# Patient Record
Sex: Male | Born: 1935 | Race: White | Hispanic: No | Marital: Married | Smoking: Never smoker
Health system: Southern US, Community
[De-identification: ages and names within clinical notes are randomized; demographics above are authoritative.]

## PROBLEM LIST (undated history)

## (undated) DIAGNOSIS — C801 Malignant (primary) neoplasm, unspecified: Secondary | ICD-10-CM

## (undated) DIAGNOSIS — I10 Essential (primary) hypertension: Secondary | ICD-10-CM

## (undated) DIAGNOSIS — D649 Anemia, unspecified: Secondary | ICD-10-CM

## (undated) HISTORY — DX: Essential (primary) hypertension: I10

## (undated) HISTORY — DX: Anemia, unspecified: D64.9

## (undated) HISTORY — DX: Malignant (primary) neoplasm, unspecified: C80.1

---

## 2004-02-17 ENCOUNTER — Inpatient Hospital Stay: Payer: Self-pay | Admitting: Cardiovascular Disease

## 2004-02-17 ENCOUNTER — Other Ambulatory Visit: Payer: Self-pay

## 2007-08-01 ENCOUNTER — Other Ambulatory Visit: Payer: Self-pay

## 2007-08-01 ENCOUNTER — Ambulatory Visit: Payer: Self-pay | Admitting: Ophthalmology

## 2007-08-03 ENCOUNTER — Inpatient Hospital Stay: Payer: Self-pay | Admitting: Internal Medicine

## 2007-08-03 ENCOUNTER — Other Ambulatory Visit: Payer: Self-pay

## 2007-08-14 ENCOUNTER — Ambulatory Visit: Payer: Self-pay | Admitting: Ophthalmology

## 2007-11-21 ENCOUNTER — Ambulatory Visit: Payer: Self-pay | Admitting: Internal Medicine

## 2009-05-14 ENCOUNTER — Ambulatory Visit: Payer: Self-pay | Admitting: Internal Medicine

## 2010-07-03 ENCOUNTER — Ambulatory Visit: Payer: Self-pay | Admitting: Internal Medicine

## 2010-12-17 ENCOUNTER — Ambulatory Visit: Payer: Self-pay | Admitting: Internal Medicine

## 2011-03-15 ENCOUNTER — Ambulatory Visit: Payer: Self-pay | Admitting: Family

## 2011-12-27 ENCOUNTER — Ambulatory Visit: Payer: Self-pay | Admitting: Emergency Medicine

## 2012-05-17 HISTORY — PX: HIP FRACTURE SURGERY: SHX118

## 2012-05-22 ENCOUNTER — Inpatient Hospital Stay: Payer: Self-pay | Admitting: Orthopedic Surgery

## 2012-05-22 LAB — COMPREHENSIVE METABOLIC PANEL
Albumin: 3.5 g/dL (ref 3.4–5.0)
Alkaline Phosphatase: 98 U/L (ref 50–136)
Anion Gap: 10 (ref 7–16)
BUN: 22 mg/dL — ABNORMAL HIGH (ref 7–18)
Calcium, Total: 8 mg/dL — ABNORMAL LOW (ref 8.5–10.1)
Chloride: 104 mmol/L (ref 98–107)
Co2: 24 mmol/L (ref 21–32)
Creatinine: 1.4 mg/dL — ABNORMAL HIGH (ref 0.60–1.30)
EGFR (African American): 49 — ABNORMAL LOW
EGFR (Non-African Amer.): 43 — ABNORMAL LOW
Glucose: 109 mg/dL — ABNORMAL HIGH (ref 65–99)
Osmolality: 280 (ref 275–301)
SGOT(AST): 25 U/L (ref 15–37)
SGPT (ALT): 20 U/L (ref 12–78)
Total Protein: 7.1 g/dL (ref 6.4–8.2)

## 2012-05-22 LAB — URINALYSIS, COMPLETE
Glucose,UR: NEGATIVE mg/dL (ref 0–75)
Leukocyte Esterase: NEGATIVE
Ph: 7 (ref 4.5–8.0)
Protein: 30
RBC,UR: 13 /HPF (ref 0–5)
Squamous Epithelial: NONE SEEN

## 2012-05-22 LAB — CBC
HCT: 33.6 % — ABNORMAL LOW (ref 40.0–52.0)
HGB: 11.5 g/dL — ABNORMAL LOW (ref 13.0–18.0)
MCH: 33.4 pg (ref 26.0–34.0)
MCV: 98 fL (ref 80–100)
Platelet: 174 10*3/uL (ref 150–440)
RBC: 3.44 10*6/uL — ABNORMAL LOW (ref 4.40–5.90)
WBC: 10 10*3/uL (ref 3.8–10.6)

## 2012-05-23 LAB — APTT: Activated PTT: 39.4 secs — ABNORMAL HIGH (ref 23.6–35.9)

## 2012-05-23 LAB — CBC WITH DIFFERENTIAL/PLATELET
Eosinophil #: 0 10*3/uL (ref 0.0–0.7)
Eosinophil %: 0.1 %
HCT: 30.7 % — ABNORMAL LOW (ref 40.0–52.0)
HGB: 10.5 g/dL — ABNORMAL LOW (ref 13.0–18.0)
Lymphocyte %: 8.4 %
MCHC: 34.2 g/dL (ref 32.0–36.0)
Neutrophil #: 7.4 10*3/uL — ABNORMAL HIGH (ref 1.4–6.5)
Neutrophil %: 80.4 %
Platelet: 133 10*3/uL — ABNORMAL LOW (ref 150–440)
RBC: 3.12 10*6/uL — ABNORMAL LOW (ref 4.40–5.90)
WBC: 9.2 10*3/uL (ref 3.8–10.6)

## 2012-05-23 LAB — BASIC METABOLIC PANEL
Calcium, Total: 8.1 mg/dL — ABNORMAL LOW (ref 8.5–10.1)
Co2: 28 mmol/L (ref 21–32)
Creatinine: 1.36 mg/dL — ABNORMAL HIGH (ref 0.60–1.30)
EGFR (African American): 51 — ABNORMAL LOW
EGFR (Non-African Amer.): 44 — ABNORMAL LOW
Osmolality: 280 (ref 275–301)
Potassium: 4.8 mmol/L (ref 3.5–5.1)
Sodium: 138 mmol/L (ref 136–145)

## 2012-05-23 LAB — PROTIME-INR: INR: 1.2

## 2012-05-24 LAB — BASIC METABOLIC PANEL
Anion Gap: 8 (ref 7–16)
BUN: 21 mg/dL — ABNORMAL HIGH (ref 7–18)
Chloride: 102 mmol/L (ref 98–107)
Co2: 24 mmol/L (ref 21–32)
Creatinine: 1.32 mg/dL — ABNORMAL HIGH (ref 0.60–1.30)
EGFR (African American): 53 — ABNORMAL LOW
Osmolality: 271 (ref 275–301)
Potassium: 3.7 mmol/L (ref 3.5–5.1)

## 2012-05-24 LAB — HEMOGLOBIN: HGB: 8.8 g/dL — ABNORMAL LOW (ref 13.0–18.0)

## 2012-05-26 LAB — HEMOGLOBIN: HGB: 8.3 g/dL — ABNORMAL LOW (ref 13.0–18.0)

## 2012-05-27 ENCOUNTER — Encounter: Payer: Self-pay | Admitting: Internal Medicine

## 2012-06-17 ENCOUNTER — Encounter: Payer: Self-pay | Admitting: Internal Medicine

## 2013-03-22 LAB — COMPREHENSIVE METABOLIC PANEL
Albumin: 3.2 g/dL — ABNORMAL LOW (ref 3.4–5.0)
Alkaline Phosphatase: 78 U/L (ref 50–136)
Calcium, Total: 8.6 mg/dL (ref 8.5–10.1)
Creatinine: 1.86 mg/dL — ABNORMAL HIGH (ref 0.60–1.30)
Osmolality: 277 (ref 275–301)
Potassium: 3.8 mmol/L (ref 3.5–5.1)
SGOT(AST): 41 U/L — ABNORMAL HIGH (ref 15–37)
SGPT (ALT): 18 U/L (ref 12–78)
Sodium: 135 mmol/L — ABNORMAL LOW (ref 136–145)

## 2013-03-22 LAB — CLOSTRIDIUM DIFFICILE(ARMC)

## 2013-03-22 LAB — CBC
HGB: 10.4 g/dL — ABNORMAL LOW (ref 13.0–18.0)
MCHC: 34.3 g/dL (ref 32.0–36.0)
MCV: 100 fL (ref 80–100)
WBC: 21.5 10*3/uL — ABNORMAL HIGH (ref 3.8–10.6)

## 2013-03-22 LAB — TROPONIN I: Troponin-I: 0.04 ng/mL

## 2013-03-23 ENCOUNTER — Inpatient Hospital Stay: Payer: Self-pay | Admitting: Internal Medicine

## 2013-03-23 LAB — URINALYSIS, COMPLETE
Glucose,UR: NEGATIVE mg/dL (ref 0–75)
Ketone: NEGATIVE
Nitrite: NEGATIVE
Specific Gravity: 1.016 (ref 1.003–1.030)
Squamous Epithelial: <1
WBC UR: 268 /HPF (ref 0–5)

## 2013-03-24 LAB — CBC WITH DIFFERENTIAL/PLATELET
Basophil %: 0.2 %
Eosinophil %: 1.3 %
HCT: 32.3 % — ABNORMAL LOW (ref 40.0–52.0)
HGB: 11.3 g/dL — ABNORMAL LOW (ref 13.0–18.0)
Lymphocyte %: 9.5 %
MCH: 35.1 pg — ABNORMAL HIGH (ref 26.0–34.0)
MCHC: 35 g/dL (ref 32.0–36.0)
Monocyte %: 8.4 %
Neutrophil #: 7.2 10*3/uL — ABNORMAL HIGH (ref 1.4–6.5)
Neutrophil %: 80.6 %
RBC: 3.22 10*6/uL — ABNORMAL LOW (ref 4.40–5.90)
RDW: 13.4 % (ref 11.5–14.5)
WBC: 8.9 10*3/uL (ref 3.8–10.6)

## 2013-03-24 LAB — BASIC METABOLIC PANEL
BUN: 29 mg/dL — ABNORMAL HIGH (ref 7–18)
Chloride: 108 mmol/L — ABNORMAL HIGH (ref 98–107)
Creatinine: 1.59 mg/dL — ABNORMAL HIGH (ref 0.60–1.30)
EGFR (African American): 42 — ABNORMAL LOW
EGFR (Non-African Amer.): 36 — ABNORMAL LOW
Glucose: 95 mg/dL (ref 65–99)
Osmolality: 281 (ref 275–301)
Potassium: 3.7 mmol/L (ref 3.5–5.1)
Sodium: 138 mmol/L (ref 136–145)

## 2013-03-25 LAB — BASIC METABOLIC PANEL
Anion Gap: 5 — ABNORMAL LOW (ref 7–16)
BUN: 23 mg/dL — ABNORMAL HIGH (ref 7–18)
Calcium, Total: 8.1 mg/dL — ABNORMAL LOW (ref 8.5–10.1)
Creatinine: 1.44 mg/dL — ABNORMAL HIGH (ref 0.60–1.30)
EGFR (African American): 48 — ABNORMAL LOW
Osmolality: 281 (ref 275–301)
Potassium: 3.6 mmol/L (ref 3.5–5.1)

## 2013-03-27 DIAGNOSIS — G3184 Mild cognitive impairment, so stated: Secondary | ICD-10-CM

## 2013-03-27 DIAGNOSIS — I10 Essential (primary) hypertension: Secondary | ICD-10-CM

## 2013-03-27 DIAGNOSIS — A419 Sepsis, unspecified organism: Secondary | ICD-10-CM

## 2013-03-27 DIAGNOSIS — N183 Chronic kidney disease, stage 3 (moderate): Secondary | ICD-10-CM

## 2013-03-28 LAB — CULTURE, BLOOD (SINGLE)

## 2013-04-17 DIAGNOSIS — R05 Cough: Secondary | ICD-10-CM

## 2013-04-25 DIAGNOSIS — D419 Neoplasm of uncertain behavior of unspecified urinary organ: Secondary | ICD-10-CM | POA: Insufficient documentation

## 2013-09-11 ENCOUNTER — Ambulatory Visit: Payer: Self-pay | Admitting: Physician Assistant

## 2013-11-06 ENCOUNTER — Ambulatory Visit: Payer: Self-pay | Admitting: Orthopedic Surgery

## 2013-12-17 DIAGNOSIS — M439 Deforming dorsopathy, unspecified: Secondary | ICD-10-CM | POA: Insufficient documentation

## 2013-12-24 DIAGNOSIS — R310 Gross hematuria: Secondary | ICD-10-CM | POA: Insufficient documentation

## 2014-01-07 ENCOUNTER — Ambulatory Visit: Payer: Self-pay | Admitting: Oncology

## 2014-01-07 ENCOUNTER — Ambulatory Visit: Payer: Self-pay | Admitting: Internal Medicine

## 2014-01-07 LAB — CBC CANCER CENTER
Basophil #: 0 x10 3/mm (ref 0.0–0.1)
Basophil %: 0.5 %
Eosinophil #: 0.3 x10 3/mm (ref 0.0–0.7)
Eosinophil %: 5.2 %
HCT: 34.8 % — ABNORMAL LOW (ref 40.0–52.0)
HGB: 11.3 g/dL — AB (ref 13.0–18.0)
LYMPHS ABS: 1 x10 3/mm (ref 1.0–3.6)
LYMPHS PCT: 17.6 %
MCH: 34.3 pg — AB (ref 26.0–34.0)
MCHC: 32.4 g/dL (ref 32.0–36.0)
MCV: 106 fL — ABNORMAL HIGH (ref 80–100)
MONOS PCT: 11.3 %
Monocyte #: 0.6 x10 3/mm (ref 0.2–1.0)
Neutrophil #: 3.7 x10 3/mm (ref 1.4–6.5)
Neutrophil %: 65.4 %
Platelet: 184 x10 3/mm (ref 150–440)
RBC: 3.29 10*6/uL — ABNORMAL LOW (ref 4.40–5.90)
RDW: 13.6 % (ref 11.5–14.5)
WBC: 5.7 x10 3/mm (ref 3.8–10.6)

## 2014-01-07 LAB — COMPREHENSIVE METABOLIC PANEL
ALT: 94 U/L — AB
ANION GAP: 7 (ref 7–16)
Albumin: 3.7 g/dL (ref 3.4–5.0)
Alkaline Phosphatase: 359 U/L — ABNORMAL HIGH
BILIRUBIN TOTAL: 0.6 mg/dL (ref 0.2–1.0)
BUN: 27 mg/dL — ABNORMAL HIGH (ref 7–18)
CHLORIDE: 101 mmol/L (ref 98–107)
CREATININE: 1.81 mg/dL — AB (ref 0.60–1.30)
Calcium, Total: 8.8 mg/dL (ref 8.5–10.1)
Co2: 28 mmol/L (ref 21–32)
EGFR (Non-African Amer.): 31 — ABNORMAL LOW
GFR CALC AF AMER: 36 — AB
Glucose: 103 mg/dL — ABNORMAL HIGH (ref 65–99)
OSMOLALITY: 277 (ref 275–301)
Potassium: 4.6 mmol/L (ref 3.5–5.1)
SGOT(AST): 58 U/L — ABNORMAL HIGH (ref 15–37)
Sodium: 136 mmol/L (ref 136–145)
Total Protein: 7.6 g/dL (ref 6.4–8.2)

## 2014-01-07 LAB — LACTATE DEHYDROGENASE: LDH: 180 U/L (ref 85–241)

## 2014-01-15 ENCOUNTER — Ambulatory Visit: Payer: Self-pay | Admitting: Oncology

## 2014-01-15 ENCOUNTER — Ambulatory Visit: Payer: Self-pay | Admitting: Internal Medicine

## 2014-02-14 ENCOUNTER — Ambulatory Visit: Payer: Self-pay | Admitting: Internal Medicine

## 2014-02-14 ENCOUNTER — Ambulatory Visit: Payer: Self-pay | Admitting: Oncology

## 2014-03-20 ENCOUNTER — Ambulatory Visit: Payer: Self-pay | Admitting: Oncology

## 2014-03-20 LAB — CBC CANCER CENTER
Basophil #: 0 x10 3/mm (ref 0.0–0.1)
Basophil %: 0.6 %
EOS ABS: 0.3 x10 3/mm (ref 0.0–0.7)
Eosinophil %: 4.8 %
HCT: 33.4 % — AB (ref 40.0–52.0)
HGB: 11 g/dL — AB (ref 13.0–18.0)
LYMPHS ABS: 0.8 x10 3/mm — AB (ref 1.0–3.6)
LYMPHS PCT: 14.9 %
MCH: 34.5 pg — ABNORMAL HIGH (ref 26.0–34.0)
MCHC: 33.1 g/dL (ref 32.0–36.0)
MCV: 105 fL — AB (ref 80–100)
MONO ABS: 0.6 x10 3/mm (ref 0.2–1.0)
MONOS PCT: 11 %
NEUTROS ABS: 3.9 x10 3/mm (ref 1.4–6.5)
Neutrophil %: 68.7 %
PLATELETS: 116 x10 3/mm — AB (ref 150–440)
RBC: 3.2 10*6/uL — AB (ref 4.40–5.90)
RDW: 13.4 % (ref 11.5–14.5)
WBC: 5.6 x10 3/mm (ref 3.8–10.6)

## 2014-03-20 LAB — BASIC METABOLIC PANEL
ANION GAP: 3 — AB (ref 7–16)
BUN: 21 mg/dL — ABNORMAL HIGH (ref 7–18)
CHLORIDE: 99 mmol/L (ref 98–107)
Calcium, Total: 8.3 mg/dL — ABNORMAL LOW (ref 8.5–10.1)
Co2: 31 mmol/L (ref 21–32)
Creatinine: 1.49 mg/dL — ABNORMAL HIGH (ref 0.60–1.30)
EGFR (Non-African Amer.): 46 — ABNORMAL LOW
GFR CALC AF AMER: 56 — AB
Glucose: 94 mg/dL (ref 65–99)
Osmolality: 269 (ref 275–301)
Potassium: 4.5 mmol/L (ref 3.5–5.1)
Sodium: 133 mmol/L — ABNORMAL LOW (ref 136–145)

## 2014-03-20 LAB — URINALYSIS, COMPLETE
Bacteria: NONE SEEN
Bilirubin,UR: NEGATIVE
GLUCOSE, UR: NEGATIVE mg/dL (ref 0–75)
Ketone: NEGATIVE
LEUKOCYTE ESTERASE: NEGATIVE
Nitrite: NEGATIVE
PH: 5 (ref 4.5–8.0)
Protein: NEGATIVE
SPECIFIC GRAVITY: 1.012 (ref 1.003–1.030)
Squamous Epithelial: <1

## 2014-03-22 LAB — URINE CULTURE

## 2014-04-16 ENCOUNTER — Ambulatory Visit: Payer: Self-pay | Admitting: Oncology

## 2014-09-06 NOTE — H&P (Signed)
PATIENT NAME:  Albert Schwartz, Albert Schwartz MR#:  [id number] DATE OF BIRTH:  1915/08/16  DATE OF ADMISSION:  03/23/2013  REFERRING PHYSICIAN: Dr. Graciella Freer.   PRIMARY CARE PHYSICIAN: Dr. Lavera Guise.   CHIEF COMPLAINT: Weakness.   HISTORY OF PRESENT ILLNESS: This is a 81 year old male who lives at home by himself. Has his daughter assist him with his driving around and shopping. Otherwise, he is independent. Has known history of hypertension and BPH, with a recent fall and hip fracture with surgical repair this January. The patient presents with weakness, dizziness, decreased p.o. intake. The patient was found to be febrile in the ED with temperature of 99.5. Had basic blood workup done which did show him to be in worsening renal failure, baseline creatinine 1.3 to 1.4, today is 1.86. As well with significant leukocytosis at 21,000. The patient had been complaining of some diarrhea with dark-colored stools. The patient was Hemoccult negative in the ED. As well, his C. diff was negative in the ED. The patient denies any melena qualities, just dark-colored stool, and he is on iron supplement. The patient's urinalysis came back significantly positive with white blood cells of 268 and +2 leukocyte esterase. As well, the patient has been complaining of some occasional cough but no productive sputum. He had CT abdomen and pelvis done in the ED which did show opacity versus atelectasis in the lung as well. The patient as well did have incidental finding on the CT abdomen and pelvis of 2 solid renal masses in the right kidney. The larger is 34 mm in diameter, suspicious for renal cell carcinoma. The patient denies any hematuria, any flank pain, any weight loss or any abdominal pain. Hospitalist service was requested to admit the patient for further management and treatment of his sepsis due to UTI. The patient's blood pressure was on the lower side initially with at some point systolic pressure in the mid 90s which did respond to  multiple fluid boluses. The patient denies any chest pain, any shortness of breath, any dysuria, polyuria or backache. Complains of generalized weakness. He does not remember if he had a fall or not, but he did appear to have some very mild laceration around his ear with some dried blood. He had CT head without contrast which did not show any acute findings.   PAST MEDICAL HISTORY:  1. Hypertension.  2. BPH.  3. Recent hip fracture repair.   ALLERGIES: NOVOCAIN.   HOME MEDICATIONS:  1. Finasteride 5 mg oral daily.  2. Lisinopril 5 mg oral daily.  3. Rapaflo 8 mg oral daily.  4. Metoprolol tartrate 25 mg 1/2 tablet 2 times a day.  5. Ferocon oral capsule 2 to 3 times a day.  6. Docusate calcium daily at bedtime.   SOCIAL HISTORY: No smoking. No drinking. No illicit drugs. Lives at home by himself independently.   FAMILY HISTORY: No family history of coronary artery disease at a young age.   REVIEW OF SYSTEMS:  CONSTITUTIONAL: The patient denies any fever even though he was afebrile here. Complains of fatigue, weakness. Denies weight gain, weight loss. Reports poor appetite.  EYES: Denies blurry vision, double vision, inflammation, glaucoma.  ENT: He is hard of hearing. Denies any ear pain, tinnitus, epistaxis.  RESPIRATORY: Denies cough, wheezing, hemoptysis, dyspnea, COPD.   CARDIOVASCULAR: Denies chest pain, edema, arrhythmia, palpitations. Complains of lightheadedness and generalized weakness.  GASTROINTESTINAL: Denies nausea, vomiting, diarrhea, abdominal pain, hematemesis, melena or rectal bleed. He had complaints of dark-colored stools.  GENITOURINARY: Denies  dysuria, hematuria, renal colic.  ENDOCRINE: Denies polyuria, polydipsia, heat or cold intolerance.  HEMATOLOGY: Denies anemia, easy bruising, bleeding diathesis.  INTEGUMENTARY: Denies acne, rash or skin lesions.  MUSCULOSKELETAL: Denies any cramps, gout.  NEUROLOGIC: Denies CVA, TIA, headache, dementia, ataxia. Complains  of lightheadedness, currently resolved.  PSYCHIATRIC: Denies anxiety, insomnia, depression.   PHYSICAL EXAMINATION:  VITAL SIGNS: Temperature 99.5, pulse 64, respiratory rate 15, blood pressure 105/58, saturating 95% on room air.  GENERAL: Elderly male, looks comfortable in bed, in no apparent distress.  HEENT: Head atraumatic, only had dried blood on the right pinna with a small skin cut there. Otherwise, no other ecchymosis or bruising noticed. Pupils equal, reactive to light. Pink conjunctivae. Anicteric sclerae. Dry oral mucosa. External ear canal had some wax. No evidence of bleed.  NECK: Supple. No thyromegaly. No JVD.  CHEST: Good air entry bilaterally. No wheezing, rales or rhonchi.  CARDIOVASCULAR: S1, S2 heard. No rubs, murmurs or gallops.  ABDOMEN: Soft, nontender, nondistended. Bowel sounds present. No CVA tenderness.  EXTREMITIES: No edema. No clubbing. No cyanosis. Pedal pulses felt bilaterally.  PSYCHIATRIC: Appropriate affect. Awake, alert x 3. Intact judgment and insight.  NEUROLOGIC: Cranial nerves grossly intact. Motor 5 out of 5. No focal deficits.  LYMPHATICS: No cervical or supraclavicular lymphadenopathy.  SKIN: Warm and dry. poor skin turgor.   PERTINENT LABORATORIES: Glucose 128, BUN 27, creatinine 1.86, sodium 135, potassium 3.8, chloride 104, CO2 25. Troponin 0.04. Total protein 6.6, total bili 0.8, alk phos 78, AST 41, ALT 18. White blood cells 21.5, hemoglobin 10.4, hematocrit 30.4, platelets 138. Urinalysis showing 268 white blood cells and +2 leukocyte esterase. C. diff toxin is negative.   IMAGING STUDIES: CT head without contrast showing atrophy and chronic ischemic white matter disease without acute intracranial abnormality. CT abdomen and pelvis showing 2 solid renal masses on the right suspicious for renal cell carcinoma. No inflammatory process in abdomen and pelvis. Left lingular and lower lobe opacity favored to be atelectasis but pneumonia would be difficult  to exclude.   ASSESSMENT AND PLAN:  1. Sepsis: This is most likely due to urinary tract infection. The patient will be started on intravenous Levaquin. Will follow on the urine culture and will adjust antibiotic as needed. It is unlikely due to pneumonia as the patient does not have any cough or productive sputum and the CT findings are inconclusive for that.  2. Incidental finding of renal masses on CT abdomen and pelvis: Will consult urology service to evaluate.  3. Hypertension: Blood pressure is on the lower side. Will hold the patient's medication. Will continue with intravenous fluids.  4. Benign prostatic hypertrophy: Will continue the patient on Rapaflo and finasteride.  5. Acute on chronic renal failure: This is due to dehydration from sepsis. Will hold the patient's ACE inhibitor. Will continue on fluids.  6. Deep vein thrombosis prophylaxis: Subcutaneous heparin.  7. Gastrointestinal prophylaxis: On proton pump inhibitor.   CODE STATUS: Discussed with the patient. He does not have a Living Will. His daughter is his healthcare power of attorney, and he reports he is DNR/DNI.   TOTAL TIME SPENT ON ADMISSION AND PATIENT CARE: 55 minutes.   ____________________________ Albertine Patricia, MD dse:gb D: 03/23/2013 03:10:50 ET T: 03/23/2013 03:53:16 ET JOB#: [id number]  cc: Albertine Patricia, MD, <Dictator> Hanifah Royse Graciela Husbands MD ELECTRONICALLY SIGNED 03/24/2013 23:59

## 2014-09-06 NOTE — Op Note (Signed)
PATIENT NAME:  Albert Schwartz, Albert Schwartz MR#:  [id number] DATE OF BIRTH:  1916/05/06  DATE OF PROCEDURE:  05/23/2012  PREOPERATIVE DIAGNOSIS:  Left intertrochanteric hip fracture.   POSTOPERATIVE DIAGNOSIS:  Left intertrochanteric hip fracture.   PROCEDURE:  Left hip ORIF with intramedullary rod and screw.   ANESTHESIA:  Spinal.   SURGEON:  Laurene Footman.   DESCRIPTION OF PROCEDURE:  Patient was brought to the operating room and after adequate spinal anesthesia was obtained the patient was transferred to the fracture table.  The right leg was placed in a well legholder, left leg in the traction boot, slight traction and internal rotation applied.  C-arm was brought in and good visualization of the hip could be obtained with near anatomic alignment.  The hip was prepped and draped using the barrier drape method.  The patient identification and timeout procedures were completed.  A proximal incision was made proximal to the greater trochanter and the deep tissue was spread with a hemostat and a guidewire was inserted into the center of the tip of the greater trochanter and advanced into the canal.  Proximal reaming was carried out and a long guidewire inserted.  Measurement taken off of this and a 380 mm rod chosen.  Reaming was carried out with a 13 mm reamer and an 11 x 380 mm 130 degree Affixus rod was then inserted over the guidewire.  The guidewire was then removed.  Using the targeting device laterally, a small lateral incision was made.  The guide was inserted down to the lateral cortex and a guidewire inserted up into the femoral head in a near center/center position on AP and lateral projections.  Measurement was taken off of this and 125 mm lag screw obtained.  The drilling was carried out through the soft tissue protector and the screw was placed in to the appropriate depth.  The proximal locking mechanism was tightened down and then partially loosened to allow for dynamic compression at the fracture  site and the insertion handle was removed.  AP and lateral images of the hip were obtained as well as distal AP at the knee showing appropriate location of the rod.  The wounds were irrigated.  The deep fascia was repaired using a running #1 Vicryl, 2-0 Vicryl subcutaneously and skin staples.   ESTIMATED BLOOD LOSS:  100 mL.   COMPLICATIONS:  None.   SPECIMEN:  None.   IMPLANTS:  Affixus hip fracture nail, left, 130 degree 11 x 380 mm and a hip fracture lag screw 10.5 mm x 125 mm.    There were no complications and condition was stable to recovery room.     ____________________________ Laurene Footman, MD mjm:ea D: 05/23/2012 23:00:54 ET T: 05/24/2012 04:44:46 ET JOB#: [id number]  cc: Laurene Footman, MD, <Dictator> Laurene Footman MD ELECTRONICALLY SIGNED 05/24/2012 12:47

## 2014-09-06 NOTE — Discharge Summary (Signed)
PATIENT NAME:  Albert Schwartz, Albert Schwartz MR#:  [id number] DATE OF BIRTH:  07-11-1915  DATE OF ADMISSION:  03/23/2013 DATE OF DISCHARGE:  03/26/2013  PRIMARY CARE PHYSICIAN: Cletis Athens, MD  DISCHARGE DIAGNOSES: 1.  Sepsis, urinary tract infection, worrisome renal carcinoma  2.  Acute on chronic renal failure.  3.  Hypertension. 4.  Benign prostatic hypertrophy. 5.  Sepsis.   CONDITION: Stable.   CODE STATUS: DO NOT RESUSCITATE.   HOME MEDICATIONS: Please refer to the [facility] physician discharge instruction medication reconciliation list. The patient will be given Levaquin 500 mg p.o. daily for 5 days.  DIET: Low sodium diet.   ACTIVITY: As tolerated.   FOLLOW-UP CARE: Follow up with PCP within 1 to 2 weeks. Follow up with Dr. Bernardo Heater, urologist, within 1 to 2 weeks for possible renal cell carcinoma.  CONSULTANTS: Dr. Bernardo Heater, urologist.  REASON FOR ADMISSION: Weakness.   HOSPITAL COURSE: The patient is a 81 year old Caucasian male with history of hypertension and BPH. The patient came to the ED with weakness. The patient was noted to have a temperature of 99.5, increased creatinine at 1.86 and significant leukocytosis at 21,000. The patient was diagnosed with UTI and admitted to the hospital. Fr detailed history and physical examinations, please refer to the admission note dictated by Dr. Waldron Labs on admission date.  1.  Sepsis with UTI. The patient has been treated with Levaquin IV. Blood culture so far is negative. Urine culture positive for Citrobacter sensitive to Levaquin. We will continue on Levaquin p.o. for another 5 days.  2.  Incidental finding of renal mass on CT of the abdomen and pelvis, suspect renal carcinoma, according to Dr. Bernardo Heater. Follow up as outpatient.  3.  Hypertension. Continue hypertension medication.  4.  Acute on chronic renal failure. The patient was dehydrated but after treatment of IV fluids has improved and near his baseline.  5.  According to physical therapy  evaluation, the patient needs skilled nursing facility placement. The patient is clinically stable and will be discharged to a skilled nursing facility today.   I discussed the patient's condition and plan of treatment with the patient's family, case Freight forwarder and nurse.   TIME SPENT: About 38 minutes.  ____________________________ Demetrios Loll, MD qc:sb D: 03/26/2013 10:14:55 ET T: 03/26/2013 10:54:39 ET JOB#: [id number]  cc: Demetrios Loll, MD, <Dictator> Demetrios Loll MD ELECTRONICALLY SIGNED 03/26/2013 16:56

## 2014-09-06 NOTE — Consult Note (Signed)
PATIENT NAME:  Albert Schwartz, Albert Schwartz MR#:  [id number] DATE OF BIRTH:  23-Sep-1915  DATE OF CONSULTATION:  03/23/2013  REFERRING PHYSICIAN:  Dr. Max Sane. CONSULTING PHYSICIAN:  Luca Burston C. Nesreen Albano, MD  REASON FOR CONSULTATION:  Renal masses on CT.  HISTORY OF PRESENT ILLNESS:  This is a 81 year old white male presented to the Emergency Department earlier this morning with complaints of weakness, dizziness and decreased p.o. intake. He had a temperature of 99.5 in the Emergency Department. Blood work is remarkable for leukocytosis, and urinalysis showed significant pyuria. He was complaining of lower abdominal discomfort, and a CT of the abdomen and pelvis was performed, which showed two masses of the right kidney that appeared to be solid. He has a history of BPH and is followed by Dr. Yves Dill. He denies dysuria, gross hematuria or flank pain. He is on finasteride 5 mg daily. He has been started on IV antibiotics pending urine culture.   PAST MEDICAL HISTORY:  1.  Hypertension.  2.  Benign prostatic hyperplasia.   PAST SURGICAL HISTORY:  A recent hip fracture with surgical repair on 05/2012.   MEDICATIONS ON ADMISSION:  1.  Finasteride 5 mg daily.  2.  Lisinopril 5 mg daily.  3.  Rapaflo 8 mg daily.  4.  Metoprolol 25 mg 1/2 tab b.i.d.  5.  Ferocon oral 2 to 3 times daily,  6.  Docusate calcium at bedtime.   ALLERGIES:  NOVOCAIN.   SOCIAL HISTORY:  No tobacco or alcohol use. The patient lives at home independently.   REVIEW OF SYSTEMS:  GENERAL:  Positive weakness and fatigue. No fever. Decreased appetite.  EYES:  No acute visual changes.  ENT:  Negative.  PULMONARY:  Denies cough, shortness of breath, wheezing.  CARDIOVASCULAR:  Denies chest pain, palpitations.  GASTROINTESTINAL:  Denies nausea, vomiting.  GENITOURINARY:  As per the HPI.  ENDOCRINE:  No increased thirst, polyuria,  HEMATOLOGIC:  No history of bleeding or clotting disorders.  SKIN:  Denies rashes. NEUROLOGIC:  Negative  CVA, seizure.  PSYCHIATRIC:  Negative anxiety, depression.   PHYSICAL EXAMINATION:  VITAL SIGNS:  Pulse 70, blood pressure 125/64.  GENERAL:  An elderly male in no acute distress.  CARDIOVASCULAR:  Regular rate and rhythm without murmur.  LUNGS:  Clear to auscultation.  ABDOMEN:  Soft, nontender, without masses. GENITOURINARY AND PROSTATE:  Deferred at this time.   LABORATORY DATA:  Creatinine is 1.86. WBC 21.5, H and H 10.4 and 30.4. Urinalysis yellow, cloudy, 3+ blood, 268 WBCs, 15 RBCs. Blood cultures preliminary are negative. CT of the abdomen and pelvis with contrast was reviewed. There is no hydronephrosis noted. There is an exophytic right lower pole renal mass measuring 3 cm with a heterogeneous enhancement. There is an exophytic superior pole mass with enhancement measuring 20 mm. There are some additional densities more likely representing cysts; however, a noncontrast study was not performed. There is a probable hyperdense cyst on the contralateral kidney. There is no hydronephrosis. No adenopathy is noted.   IMPRESSION:  1.  Enhancing right renal mass incidentally noted on CT that statistically are most likely renal-cell carcinoma.  2.  Probable renal cysts.  3.  Urinary tract infection, most likely secondary to prostatitis.  4.  Benign prostatic hypertrophy.   RECOMMENDATIONS:   1.  There is no indication for acute intervention of these renal masses at the present. Based on his age, observation is certainly an option. Consideration could be given for percutaneous cryoablation or percutaneous RSA. 2.  Dr. Yves Dill  is his established urologist and would recommend a followup after discharge for further discussion. If desired, we would be happy to see him in followup in addition.  ____________________________ Ronda Fairly Bernardo Heater, MD scs:jm D: 03/23/2013 15:13:48 ET T: 03/23/2013 15:48:13 ET JOB#: [id number]  cc: Nicki Reaper C. Bernardo Heater, MD, <Dictator> Abbie Sons MD ELECTRONICALLY SIGNED  03/28/2013 12:49

## 2014-09-06 NOTE — Consult Note (Signed)
PATIENT NAME:  Albert Schwartz, Albert Schwartz MR#:  [id number] DATE OF BIRTH:  Dec 10, 1915  DATE OF CONSULTATION:  05/22/2012  CONSULTING PHYSICIAN:  Demetrios Loll, MD  PRIMARY CARE PHYSICIAN:  Dr. Lavera Guise.  REFERRAL PHYSICIAN:  Dr. Rudene Christians.  REASON FOR CONSULTATION: Preop evaluation.   HISTORY OF PRESENT ILLNESS:  A 81 year old Caucasian male with a history of hypertension, BPH, was brought to ED due to fall today. The patient is alert, awake, oriented, but has a hearing problem. According to the patient's son-in-law, the patient fell by accident in a store today and got a hip injury, diagnosed with left hip fracture. The patient denies any syncope, loss of consciousness or seizure. No incontinence, dysphagia or slurred speech. The patient is very independent. Blood pressure is very well controlled at home. Dr. Rudene Christians requested the hospitalist for preop consult evaluation.   PAST MEDICAL HISTORY:  Hypertension, BPH.  SOCIAL HISTORY: No smoking or drinking or illicit drugs. Living at home by himself independently.   PAST SURGICAL HISTORY: None.   FAMILY HISTORY: Unclear.   REVIEW OF SYSTEMS:   CONSTITUTIONAL: The patient denies any fever or chills. No headache or dizziness. No weakness.  EYES:  No double vision, blurred vision.  EAR, NOSE, THROAT: No postnasal drip, slurred speech or dysphagia.  CARDIOVASCULAR: No chest pain, palpitation. No orthopnea or nocturnal dyspnea. No leg edema.  PULMONARY: No cough, sputum, shortness of breath or hematemesis.  GASTROINTESTINAL: No abdominal pain, nausea, vomiting or diarrhea. No melena or bloody stool.  GENITOURINARY: No dysuria, hematuria or incontinence.  SKIN: No rash or jaundice.  NEUROLOGIC:  No syncope, loss of consciousness or seizure.  MUSCULOSKELETAL: Left hip pain. No leg edema.  HEMATOLOGY: No easy bruising or bleeding.   ALLERGIES: NOVOCAINE.  HOME MEDICATIONS: Uribel oral capsule 1 capsule 4 times a day, Rapaflo 8 mg p.o. daily, finasteride 5 mg p.o.  daily, Alphagan 0.1% 1 drop to each eye b.i.d.   PHYSICAL EXAMINATION: VITAL SIGNS: Temperature 97.2, blood pressure 140/68, pulse 78, respirations 22, O2 saturation 99% on room air.  GENERAL: The patient is alert, awake, oriented, in no acute distress.  HEENT: Pupils round and equal to light and accommodation. Moist oral mucosa. Clear oropharynx.  NECK: Supple. No JVD or carotid bruit. No lymphadenopathy. No thyromegaly.  CARDIOVASCULAR: S1, S2, regular rate, rhythm. No murmurs or gallops.  PULMONARY: Bilateral air entry. No wheezing or rales. No use of accessory muscles to breathe.  ABDOMEN: Soft. No distention. No tenderness. No organomegaly. Bowel sounds present.  EXTREMITIES: No edema, clubbing or cyanosis. No calf tenderness. Left lower extremity deformity.  SKIN: No rash or jaundice.  NEUROLOGY: A and O x 3. No focal deficit.   LABORATORY DATA: Glucose 109, BUN 22, creatinine 1.4, sodium 138, potassium 3.9, chloride 104, bicarb 24. WBC 10, hemoglobin 11.58,  platelets 174.   IMAGING: CAT scan of head: Chronic and involutional changes without evidence of acute abnormality.  CAT scan of cervical spine: No acute injury of cervical spine. X-ray showed left hip fracture.   IMPRESSION: 1.  Left hip fracture.  2.  Acute renal failure with dehydration.  3.  Anemia.  4.  Hypertension.  4.  Benign prostatic hypertrophy.   RECOMMENDATIONS:  1.  The patient has a moderate risk for hip surgery. We will get an EKG and start Lopressor b.i.d. at 12.5 mg.  2.  For acute renal failure and dehydration, we will start normal saline at 75 mL/hour and  follow up benign prostatic hypertrophy. Follow up BMP.  3.  Follow up hemoglobin after surgery.  4.  DVT prophylaxis after surgery.   Discussed with the patient the situation and recommendations with the patient and the patient's son-in-law.   TIME SPENT: About 50 minutes.    ____________________________ Demetrios Loll, MD qc:dm D: 05/22/2012  18:54:13 ET T: 05/22/2012 21:41:37 ET JOB#: [id number]  cc: Demetrios Loll, MD, <Dictator> Demetrios Loll MD ELECTRONICALLY SIGNED 05/23/2012 20:29

## 2014-09-06 NOTE — Discharge Summary (Signed)
PATIENT NAME:  Albert Schwartz, Albert Schwartz MR#:  [id number] DATE OF BIRTH:  [birth year]  DATE OF ADMISSION:  05/22/2012 DATE OF DISCHARGE:  05/26/2012  ADMITTING DIAGNOSIS: Left intertrochanteric hip fracture.   DISCHARGE DIAGNOSIS: Left intertrochanteric hip fracture.   PROCEDURE: Left hip ORIF with intramedullary rod and screw.   ANESTHESIA: Spinal.   SURGEON: Laurene Footman, M.D.   COMPLICATIONS: None.   SPECIMENS: None.   IMPLANTS: Affixus hip fracture nail, left, 130 degree 11 x 380 mm, and a hip fracture lag screw 10.5 mm x 125 mm.   HISTORY: The patient is a 81 year old male who has been living independently. He went to a [facility] in [place] and while checking out tripped and was unable to get up. He was brought to the ER where he was found to have an intertrochanteric hip fracture, and he is being admitted for treatment of this. 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   PHYSICAL EXAMINATION:  GENERAL: He is well developed, well-nourished white male, appears younger than his stated age, in mild distress.  HEENT: Unremarkable. Teeth are in fair condition.  NECK: Nontender.  LUNGS: Clear.  HEART: Regular rate and rhythm.  ABDOMEN: Soft, nontender with positive bowel sounds.  EXTREMITIES: The left lower extremity is shortened and externally rotated. He is able to flex and extend the toes. He has a trace dorsalis pedis and posterior tibialis pulse. Sensation is intact. His left upper thigh shows mild swelling. No ecchymosis is present.   HOSPITAL COURSE: The patient was brought to the ER on 05/22/2012 with admission for a left intertrochanteric hip fracture. The patient was cleared by medicine and brought to the orthopedic floor in stable condition. On 05/23/2012, the patient was then taken to the OR where  the left hip ORIF was performed. The patient's surgery went well and he was brought to the orthopedic floor in the PACU in stable condition. On postop day 1,  the patient was doing well. Pain was controlled. His vital signs were stable. The patient did have acute postop blood loss anemia with hemoglobin down to 8.8 on postop day 1. On postop day 2, the patient's hemoglobin had dropped down below 8 at 7.6. He was given a transfusion of 1 unit of packed red blood cells, which did increase his hemoglobin to 8.3 on postop day 3. Throughout the patient's stay, the patient's vital signs remained stable. He did progress well with physical therapy with ambulation up to 10 feet. He had no pain throughout his stay. He slept well at nighttime. The patient was doing well and ready for discharge to rehab facility on 05/26/2012. On postop day 2, the patient's Foley was removed and he was given Flomax prior to Foley removal, which did help with his urination afterwards. Throughout the patient's stay, he was able to urinate on his own and had no problems with flow due to BPH.   CONDITION AT DISCHARGE: Stable.   DISCHARGE INSTRUCTIONS: The patient may gradually increase weight-bearing on the affected extremity, elevate the affected foot and leg on 1 or 2 pillows with the foot higher than the knee. Thigh-high TED hose on both legs and remove 1 hour per 8-hour shift. Elevate heels off the bed. Incentive spirometry every hour while awake and encourage cough and deep breathing.   DIET: He may resume a regular diet as tolerated.   WOUND CARE: Apply ice pack to affected area. Do not get the dressing or bandage wet or dirty. Call [facility]  if the dressing gets water under it. Leave dressing on. Change dressing as needed.   STAPLE CARE: Staples need to be removed and benzoin and Steri-Strips need to be applied on 06/07/2012.   SYMPTOMS TO REPORT: Call [facility] if any of the following occur: Bright red bleeding from the incision or wound, fever above 101.5 degrees, redness, swelling or drainage at the incision. Call [facility] if you experience any increased leg pain,  numbness or weakness or bowel or bladder symptoms.   REFERRALS: He is referred to rehab for physical therapy and occupational therapy. He has a followup appointment with [facility] in 6 weeks. They need to call for an appointment and schedule with Dr. Rudene Christians or his PA, Rachelle Hora.   DISCHARGE MEDICATIONS: Rapaflo 8 mg oral capsule 1 cap orally once a day, Alphagan-P 0.1% ophthalmic solution 1 drop to each affected eye 2 times a day, finasteride 5 mg oral tablet 1 tablet orally once a day, Uribel oral capsule 1 cap orally 4 times a day as needed, Tylenol 500 mg oral tablet 1 tablet orally every 4 hours as needed for pain or temp greater than 100.4,  Vicodin 5/325 one tablet orally every 4 hours as needed for pain, magnesium hydroxide 8% oral 30 mL oral 2 times a day as needed, bisacodyl 10 mg rectal suppository 1 suppository rectal 2 times a day as needed for constipation, docusate calcium 240 mg oral capsule 1 cap orally once a day at bedtime, docusate/senna 50 mg/8.6 mg oral tablet 1 tab orally 2 times a day, Lovenox 40 mg injectable solution subcutaneously 1 injection subcutaneously once a day x 14 days, metoprolol 12.5 mg oral 2 times a day, Ferocon oral capsule 1 orally 2 times a day with meals.    ____________________________ Duanne Guess, PA-C tcg:jm D: 05/26/2012 13:48:14 ET T: 05/26/2012 14:10:50 ET JOB#: [id number]  cc: Duanne Guess, PA-C, <Dictator> Duanne Guess [place] ELECTRONICALLY SIGNED 06/06/2012 15:43

## 2014-09-06 NOTE — H&P (Signed)
PATIENT NAME:  Albert Schwartz, GAMEL MR#:  [id number] DATE OF BIRTH:  [birth year]  DATE OF ADMISSION:  05/22/2012  CHIEF COMPLAINT: Left hip pain.   HISTORY: The patient is a 81 year old who has been living independently. He went to a [facility] in [place] and while checking out tripped and was unable to get up. He was brought to the Emergency Room where he was found to have an intertrochanteric hip fracture, and he is being admitted for treatment of this. 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   PAST HISTORY: Remarkable for allergy to NOVOCAIN. He is not certain what the reaction is. He has history of hypertension, prostate hypertrophy and prior cataract surgery.   MEDICATIONS ON ADMISSION: Uribel 1 capsule 4 times a day, Rapaflo 8 mg p.o. daily, finasteride 5 mg daily, Alphagan eyedrops 1 drop to each eye b.i.d.   SOCIAL HISTORY: He is a nondrinker, nonsmoker and widowed. He does have children in the area who do help him.    REVIEW OF SYSTEMS: Positive for being hard of hearing. He denies loss of consciousness, chest pain or shortness of breath.   PHYSICAL EXAM:  GENERAL: He is a well-developed, well-nourished, white male, appears younger than his stated age, in mild distress.  HEENT: Unremarkable. Teeth are in fair condition.  NECK: Nontender.  LUNGS: Clear.  HEART: Regular rate and rhythm.  ABDOMEN: Soft, nontender, with positive bowel sounds.  EXTREMITIES: The left lower extremity is shortened and externally rotated. He is able to flex and extend the toes. He has a trace dorsalis pedis and posterior tib pulse. Sensation is intact. His left upper thigh shows mild swelling. No ecchymosis at present.   X-RAYS: Reveal left intertrochanteric fracture without lesser trochanteric involvement.   CLINICAL IMPRESSION: Left intertrochanteric hip fracture.   PLAN: ORIF. I discussed with him and his son using a long IM rod to protect the rest of the femur postop and  hopefully minimize the risk of another fracture. The risks, benefits and possible complications were discussed as well as transfusion. Consents, perioperative antibiotic use and postop anticoagulation all discussed. They appear to understand this and would like to proceed. Will plan on proceeding with surgery tomorrow if he has had a medical evaluation, to get him mobilized and hopefully get him back home.   ____________________________ Laurene Footman, MD mjm:gb D: 05/22/2012 20:58:57 ET T: 05/22/2012 21:13:52 ET JOB#: [id number]  cc: Laurene Footman, MD, <Dictator> Laurene Footman MD ELECTRONICALLY SIGNED 05/22/2012 23:29

## 2014-09-06 NOTE — Consult Note (Signed)
Brief Consult Note: Diagnosis: Right renal masses.   Patient was seen by consultant.   Consult note dictated.   Comments: no need for intervention presently.  Electronic Signatures: Abbie Sons (MD)  (Signed [phone] 16:13)  Authored: Brief Consult Note   Last Updated: 07-Nov-14 16:13 by Abbie Sons (MD)

## 2014-11-14 ENCOUNTER — Other Ambulatory Visit: Payer: Medicare Other

## 2014-11-14 ENCOUNTER — Inpatient Hospital Stay: Payer: Medicare Other | Attending: Oncology | Admitting: Oncology

## 2014-11-14 ENCOUNTER — Inpatient Hospital Stay: Payer: Medicare Other

## 2014-11-14 ENCOUNTER — Other Ambulatory Visit: Payer: Self-pay | Admitting: *Deleted

## 2014-11-14 VITALS — BP 184/81 | HR 70 | Temp 96.6°F | Wt 127.2 lb

## 2014-11-14 DIAGNOSIS — R630 Anorexia: Secondary | ICD-10-CM | POA: Diagnosis not present

## 2014-11-14 DIAGNOSIS — I10 Essential (primary) hypertension: Secondary | ICD-10-CM | POA: Diagnosis not present

## 2014-11-14 DIAGNOSIS — D4101 Neoplasm of uncertain behavior of right kidney: Secondary | ICD-10-CM | POA: Insufficient documentation

## 2014-11-14 DIAGNOSIS — Z79899 Other long term (current) drug therapy: Secondary | ICD-10-CM

## 2014-11-14 DIAGNOSIS — N2889 Other specified disorders of kidney and ureter: Secondary | ICD-10-CM

## 2014-11-14 DIAGNOSIS — D4102 Neoplasm of uncertain behavior of left kidney: Secondary | ICD-10-CM | POA: Diagnosis present

## 2014-11-14 DIAGNOSIS — R54 Age-related physical debility: Secondary | ICD-10-CM | POA: Diagnosis not present

## 2014-11-14 DIAGNOSIS — R634 Abnormal weight loss: Secondary | ICD-10-CM | POA: Diagnosis not present

## 2014-11-14 LAB — CBC WITH DIFFERENTIAL/PLATELET
BASOS ABS: 0 10*3/uL (ref 0–0.1)
Basophils Relative: 1 %
EOS PCT: 7 %
Eosinophils Absolute: 0.4 10*3/uL (ref 0–0.7)
HEMATOCRIT: 33 % — AB (ref 40.0–52.0)
Hemoglobin: 10.8 g/dL — ABNORMAL LOW (ref 13.0–18.0)
Lymphocytes Relative: 16 %
Lymphs Abs: 0.9 10*3/uL — ABNORMAL LOW (ref 1.0–3.6)
MCH: 33.1 pg (ref 26.0–34.0)
MCHC: 32.7 g/dL (ref 32.0–36.0)
MCV: 101.5 fL — AB (ref 80.0–100.0)
Monocytes Absolute: 0.6 10*3/uL (ref 0.2–1.0)
Monocytes Relative: 11 %
NEUTROS PCT: 65 %
Neutro Abs: 3.7 10*3/uL (ref 1.4–6.5)
Platelets: 140 10*3/uL — ABNORMAL LOW (ref 150–440)
RBC: 3.26 MIL/uL — AB (ref 4.40–5.90)
RDW: 13.1 % (ref 11.5–14.5)
WBC: 5.6 10*3/uL (ref 3.8–10.6)

## 2014-11-14 LAB — COMPREHENSIVE METABOLIC PANEL
ALBUMIN: 3.5 g/dL (ref 3.5–5.0)
ALT: 11 U/L — ABNORMAL LOW (ref 17–63)
ANION GAP: 4 — AB (ref 5–15)
AST: 17 U/L (ref 15–41)
Alkaline Phosphatase: 81 U/L (ref 38–126)
BILIRUBIN TOTAL: 0.4 mg/dL (ref 0.3–1.2)
BUN: 44 mg/dL — ABNORMAL HIGH (ref 6–20)
CALCIUM: 7.8 mg/dL — AB (ref 8.9–10.3)
CO2: 24 mmol/L (ref 22–32)
Chloride: 104 mmol/L (ref 101–111)
Creatinine, Ser: 2.1 mg/dL — ABNORMAL HIGH (ref 0.61–1.24)
GFR, EST AFRICAN AMERICAN: 29 mL/min — AB (ref >60)
GFR, EST NON AFRICAN AMERICAN: 25 mL/min — AB (ref >60)
Glucose, Bld: 91 mg/dL (ref 65–99)
POTASSIUM: 5.1 mmol/L (ref 3.5–5.1)
Sodium: 132 mmol/L — ABNORMAL LOW (ref 135–145)
Total Protein: 7.4 g/dL (ref 6.5–8.1)

## 2014-11-14 MED ORDER — PREDNISONE 10 MG PO TABS: 10.0000 mg | ORAL_TABLET | Freq: Every day | ORAL | Status: DC

## 2014-11-14 NOTE — Progress Notes (Signed)
Patient does not have living will.  Never smoked. 

## 2014-11-17 ENCOUNTER — Encounter: Payer: Self-pay | Admitting: Oncology

## 2014-11-17 NOTE — Progress Notes (Signed)
[facility] @ [facility] [phone]  Fax:([phone] Reader OB: 03-09-1916  MR#: [id number]  ZDG#:[id number]  Patient Care Team: Venia Carbon, MD as PCP - General (Internal Medicine)  CHIEF COMPLAINT:  Chief Complaint/Diagnosis:   81 year old gentleman had an abnormal CT scan.  CT scan done at [facility] revealed the bilateral solid adrenal masses.  Multiple solid renal mass is arising from the upper pole of the right kidney and lateral aspect of the right kidney infiltrating tumor.  Infiltrating and distorting collecting system and replacing majority of left kidney.  (August of 20184) HPI:   81 year old gentleman with hematuria was investigated by primary care physician followed by urologist.  Patient had abnormal CT scan with bilateral multiple solid tumor masses.  Patient was referred to be for further evaluation and treatment consideration.  Patient does have chronic back pain MRI scan was done recently after a fall revealed compression fracture.,  Has not lost significant weight, the patient is very hard of hearing so most of the history as per HPI. March 20, 2014 Patient is here for further follow-up regarding clinically diagnosis bilateral renal cell cancer.  Considering patient's age no further aggressive therapy has been prolonged.  No abdominal pain.  No hematuria.  However family reports cloudy urine. No burning.  No stinging.  Appetite has been stable.  Patient lives by [facility]  July, 2016 Patient came today further follow-up regarding bilateral kidney masses which has not been evaluated at patient's request by biopsy.  Patient's condition has been slowly declining poor appetite and losing weight.  No hematuria or dysuria  No flowsheet data found.  INTERVAL HISTORY:  81 year old gentleman came today further follow-up.  Since last evaluation patient has lost further weight.  Poor appetite.  No hematuria.  No flank pain.  Patient has bilateral masses  on CT scan and because of his age and because of bilateral malignancy was decided to hold off any further investigation at patient and family request  REVIEW OF SYSTEMS:   Gen. status: Patient is feeling weak and tired.  Losing weight.  HEENT: Denies any headache no dizziness GU: No dysuria hematuria Lungs: Shortness of breath on exertion GI: Poor appetite losing weight no flank pain.  Musculoskeletal system no bony pain.  Lower extremity no swelling neurological system the [facility] system some depression  As per HPI. Otherwise, a complete review of systems is negatve.  Significant History/PMH:   Prostate problems:    Hypertension:   Preventive Screening:  Has patient had any of the following test? Colonscopy  Prostate Exam   Last Colonoscopy: Never   Last Prostate Exam: 2013   Smoking History: Smoking History Never Smoked.  PFSH: Comments: ovarian cancer in family  Social History: negative alcohol, negative tobacco  Additional Past Medical and Surgical History: As mentioned above   ADVANCED DIRECTIVES:  No flowsheet data found.  HEALTH MAINTENANCE: History  Substance Use Topics  . Smoking status: Not on file  . Smokeless tobacco: Not on file  . Alcohol Use: Not on file      Allergies  Allergen Reactions  . Procaine Nausea And Vomiting    Patient does not have any living will and information was given  OBJECTIVE:  Filed Vitals:   11/14/14 1448  BP: 184/81  Pulse: 70  Temp: 96.6 F (35.9 C)     There is no height on file to calculate BMI.    ECOG FS:2 - Symptomatic, <50% confined to bed  PHYSICAL EXAM: GENERAL:  Well developed, well nourished, sitting comfortably in the exam room in no acute distress. MENTAL STATUS:  Alert and oriented to person, place and time. HEAD:  .  Normocephalic, atraumatic, face symmetric, no Cushingoid features. EYES: .  Pupils equal round and reactive to light and accomodation.  No conjunctivitis or scleral  icterus. ENT:  Oropharynx clear without lesion.  Tongue normal. Mucous membranes moist.  RESPIRATORY:  Clear to auscultation without rales, wheezes or rhonchi. CARDIOVASCULAR:  Regular rate and rhythm without murmur, rub or gallop.  ABDOMEN:  Soft, non-tender, with active bowel sounds, and no hepatosplenomegaly.  No masses. BACK:  No CVA tenderness.  No tenderness on percussion of the back or rib cage. SKIN:  No rashes, ulcers or lesions. EXTREMITIES: No edema, no skin discoloration or tenderness.  No palpable cords. LYMPH NODES: No palpable cervical, supraclavicular, axillary or inguinal adenopathy  NEUROLOGICAL: Unremarkable. PSYCH:  Appropriate.   LAB RESULTS:  Appointment on 11/14/2014  Component Date Value Ref Range Status  . WBC 11/14/2014 5.6  3.8 - 10.6 K/uL Final  . RBC 11/14/2014 3.26* 4.40 - 5.90 MIL/uL Final  . Hemoglobin 11/14/2014 10.8* 13.0 - 18.0 g/dL Final  . HCT 11/14/2014 33.0* 40.0 - 52.0 % Final  . MCV 11/14/2014 101.5* 80.0 - 100.0 fL Final  . MCH 11/14/2014 33.1  26.0 - 34.0 pg Final  . MCHC 11/14/2014 32.7  32.0 - 36.0 g/dL Final  . RDW 11/14/2014 13.1  11.5 - 14.5 % Final  . Platelets 11/14/2014 140* 150 - 440 K/uL Final  . Neutrophils Relative % 11/14/2014 65   Final  . Neutro Abs 11/14/2014 3.7  1.4 - 6.5 K/uL Final  . Lymphocytes Relative 11/14/2014 16   Final  . Lymphs Abs 11/14/2014 0.9* 1.0 - 3.6 K/uL Final  . Monocytes Relative 11/14/2014 11   Final  . Monocytes Absolute 11/14/2014 0.6  0.2 - 1.0 K/uL Final  . Eosinophils Relative 11/14/2014 7   Final  . Eosinophils Absolute 11/14/2014 0.4  0 - 0.7 K/uL Final  . Basophils Relative 11/14/2014 1   Final  . Basophils Absolute 11/14/2014 0.0  0 - 0.1 K/uL Final  . Sodium 11/14/2014 132* 135 - 145 mmol/L Final  . Potassium 11/14/2014 5.1  3.5 - 5.1 mmol/L Final  . Chloride 11/14/2014 104  101 - 111 mmol/L Final  . CO2 11/14/2014 24  22 - 32 mmol/L Final  . Glucose, Bld 11/14/2014 91  65 - 99 mg/dL  Final  . BUN 11/14/2014 44* 6 - 20 mg/dL Final  . Creatinine, Ser 11/14/2014 2.10* 0.61 - 1.24 mg/dL Final  . Calcium 11/14/2014 7.8* 8.9 - 10.3 mg/dL Final  . Total Protein 11/14/2014 7.4  6.5 - 8.1 g/dL Final  . Albumin 11/14/2014 3.5  3.5 - 5.0 g/dL Final  . AST 11/14/2014 17  15 - 41 U/L Final  . ALT 11/14/2014 11* 17 - 63 U/L Final  . Alkaline Phosphatase 11/14/2014 81  38 - 126 U/L Final  . Total Bilirubin 11/14/2014 0.4  0.3 - 1.2 mg/dL Final  . GFR calc non Af Amer 11/14/2014 25* >60 mL/min Final  . GFR calc Af Amer 11/14/2014 29* >60 mL/min Final   Comment: (NOTE) The eGFR has been calculated using the CKD EPI equation. This calculation has not been validated in all clinical situations. eGFR's persistently <60 mL/min signify possible Chronic Kidney Disease.   . Anion gap 11/14/2014 4* 5 - 15 Final     ASSESSMENT:  Bilateral kidney masses further workup was not done because of patient's age and family and patient was reluctant to proceed with any further treatment Weight loss   MEDICAL DECISION MAKING:  All lab data has been reviewed.  Patient was started on prednisone 10 mg daily and was advised to call [facility] to see within a week if appetite improves. We will see this patient as needed basis and patient and family will call me if there is any new problem.  Meanwhile we discussed possibility of living will as well as hospice care  Patient expressed understanding and was in agreement with this plan. He also understands that He can call clinic at any time with any questions, concerns, or complaints.    No matching staging information was found for the patient.  Forest Gleason, MD   11/17/2014 3:00 PM

## 2014-11-22 ENCOUNTER — Ambulatory Visit
Admission: RE | Admit: 2014-11-22 | Discharge: 2014-11-22 | Disposition: A | Discharge status: Home / Self Care | Payer: Medicare Other | Source: Ambulatory Visit | Attending: Family Medicine | Admitting: Family Medicine

## 2014-11-22 ENCOUNTER — Encounter: Payer: Self-pay | Admitting: Family Medicine

## 2014-11-22 ENCOUNTER — Emergency Department
Admission: EM | Admit: 2014-11-22 | Discharge: 2014-11-23 | Disposition: A | Discharge status: Home / Self Care | Payer: Medicare Other | Attending: Surgery | Admitting: Surgery

## 2014-11-22 ENCOUNTER — Ambulatory Visit (INDEPENDENT_AMBULATORY_CARE_PROVIDER_SITE_OTHER): Payer: Medicare Other | Admitting: Family Medicine

## 2014-11-22 ENCOUNTER — Emergency Department: Payer: Medicare Other

## 2014-11-22 VITALS — BP 150/78 | HR 80 | Temp 98.4°F | Resp 16 | Ht 67.0 in | Wt 128.2 lb

## 2014-11-22 DIAGNOSIS — M549 Dorsalgia, unspecified: Secondary | ICD-10-CM | POA: Insufficient documentation

## 2014-11-22 DIAGNOSIS — K5909 Other constipation: Secondary | ICD-10-CM | POA: Insufficient documentation

## 2014-11-22 DIAGNOSIS — K4090 Unilateral inguinal hernia, without obstruction or gangrene, not specified as recurrent: Secondary | ICD-10-CM

## 2014-11-22 DIAGNOSIS — Z79899 Other long term (current) drug therapy: Secondary | ICD-10-CM | POA: Diagnosis not present

## 2014-11-22 DIAGNOSIS — R1909 Other intra-abdominal and pelvic swelling, mass and lump: Secondary | ICD-10-CM | POA: Diagnosis not present

## 2014-11-22 DIAGNOSIS — K460 Unspecified abdominal hernia with obstruction, without gangrene: Secondary | ICD-10-CM | POA: Insufficient documentation

## 2014-11-22 DIAGNOSIS — I10 Essential (primary) hypertension: Secondary | ICD-10-CM | POA: Insufficient documentation

## 2014-11-22 DIAGNOSIS — Q846 Other congenital malformations of nails: Secondary | ICD-10-CM | POA: Insufficient documentation

## 2014-11-22 DIAGNOSIS — H401290 Low-tension glaucoma, unspecified eye, stage unspecified: Secondary | ICD-10-CM | POA: Insufficient documentation

## 2014-11-22 DIAGNOSIS — D649 Anemia, unspecified: Secondary | ICD-10-CM | POA: Insufficient documentation

## 2014-11-22 DIAGNOSIS — R319 Hematuria, unspecified: Secondary | ICD-10-CM | POA: Insufficient documentation

## 2014-11-22 LAB — CBC
HCT: 38.7 % — ABNORMAL LOW (ref 40.0–52.0)
HEMOGLOBIN: 12.9 g/dL — AB (ref 13.0–18.0)
MCH: 33.9 pg (ref 26.0–34.0)
MCHC: 33.4 g/dL (ref 32.0–36.0)
MCV: 101.2 fL — AB (ref 80.0–100.0)
PLATELETS: 209 10*3/uL (ref 150–440)
RBC: 3.82 MIL/uL — AB (ref 4.40–5.90)
RDW: 13.4 % (ref 11.5–14.5)
WBC: 15.1 10*3/uL — AB (ref 3.8–10.6)

## 2014-11-22 LAB — COMPREHENSIVE METABOLIC PANEL
ALK PHOS: 94 U/L (ref 38–126)
ALT: 11 U/L — AB (ref 17–63)
AST: 24 U/L (ref 15–41)
Albumin: 4.3 g/dL (ref 3.5–5.0)
Anion gap: 13 (ref 5–15)
BILIRUBIN TOTAL: 0.5 mg/dL (ref 0.3–1.2)
BUN: 39 mg/dL — ABNORMAL HIGH (ref 6–20)
CO2: 25 mmol/L (ref 22–32)
Calcium: 9.3 mg/dL (ref 8.9–10.3)
Chloride: 98 mmol/L — ABNORMAL LOW (ref 101–111)
Creatinine, Ser: 2.23 mg/dL — ABNORMAL HIGH (ref 0.61–1.24)
GFR calc Af Amer: 27 mL/min — ABNORMAL LOW (ref >60)
GFR, EST NON AFRICAN AMERICAN: 23 mL/min — AB (ref >60)
Glucose, Bld: 175 mg/dL — ABNORMAL HIGH (ref 65–99)
POTASSIUM: 4.9 mmol/L (ref 3.5–5.1)
SODIUM: 136 mmol/L (ref 135–145)
Total Protein: 9.1 g/dL — ABNORMAL HIGH (ref 6.5–8.1)

## 2014-11-22 LAB — LIPASE, BLOOD: LIPASE: 47 U/L (ref 22–51)

## 2014-11-22 MED ORDER — HYDROCODONE-ACETAMINOPHEN 5-325 MG PO TABS: 1.0000 | ORAL_TABLET | ORAL | Status: AC | PRN

## 2014-11-22 MED ORDER — HYDROCODONE-ACETAMINOPHEN 5-325 MG PO TABS: 1.0000 | ORAL_TABLET | Freq: Once | ORAL | Status: DC

## 2014-11-22 MED ORDER — IOHEXOL 240 MG/ML SOLN
25.0000 mL | Freq: Once | INTRAMUSCULAR | Status: AC | PRN
  Administered 2014-11-22: 25 mL via ORAL

## 2014-11-22 MED ORDER — IOHEXOL 240 MG/ML SOLN: 25.0000 mL | Freq: Once | INTRAMUSCULAR | Status: AC | PRN

## 2014-11-22 NOTE — Consult Note (Signed)
Surgical Consultation  11/22/2014  Albert Schwartz is an 81 y.o. male.   CC: Right groin pain  HPI: I was asked see this patient in the emergency room by Dr. Corky Downs. Dr. Corky Downs was able to reduce the right inguinal hernia which had been painful for 1 day. He had had some nausea and vomiting. He feels better now. Of note he is a very poor historian and appears demented. His family is present and answers most of the questions. Family member states that he did not know that he had this hernia prior to today.  Additional past medical history not noted below is that of probable kidney cancer for which he sees oncology but has not been treated for it and has declined any sort of therapy for it. This decision was made based on his age and frail nature.  Past Medical History  Diagnosis Date  . Hypertension     No past surgical history on file.  Family History  Problem Relation Age of Onset  . Healthy Mother   . Healthy Father     Social History:  reports that he has never smoked. He does not have any smokeless tobacco history on file. He reports that he does not drink alcohol or use illicit drugs.  Allergies:  Allergies  Allergen Reactions  . Abacavir   . Procaine Nausea And Vomiting    Medications reviewed.   Review of Systems:   Review of Systems  Unable to perform ROS: dementia     Physical Exam:  BP 154/87 mmHg  Pulse 86  Temp(Src) 97.7 F (36.5 C) (Oral)  Resp 21  SpO2 100%  Physical Exam  Constitutional: No distress.  Cachectic-appearing male patient, no acute distress  HENT:  Head: Normocephalic and atraumatic.  Abdominal: Soft. He exhibits no distension. There is no tenderness. There is no rebound and no guarding.  Genitourinary:  A groin bulge beneath a large scar signifying prior inguinal hernia repair on the right. All testicle  This was gently massaged and reduced. This was performed without much difficulty.  Neurological: He is alert.       Results for orders placed or performed during the hospital encounter of 11/22/14 (from the past 48 hour(s))  CBC     Status: Abnormal   Collection Time: 11/22/14  8:55 PM  Result Value Ref Range   WBC 15.1 (H) 3.8 - 10.6 K/uL   RBC 3.82 (L) 4.40 - 5.90 MIL/uL   Hemoglobin 12.9 (L) 13.0 - 18.0 g/dL   HCT 38.7 (L) 40.0 - 52.0 %   MCV 101.2 (H) 80.0 - 100.0 fL   MCH 33.9 26.0 - 34.0 pg   MCHC 33.4 32.0 - 36.0 g/dL   RDW 13.4 11.5 - 14.5 %   Platelets 209 150 - 440 K/uL  Comprehensive metabolic panel     Status: Abnormal   Collection Time: 11/22/14  8:55 PM  Result Value Ref Range   Sodium 136 135 - 145 mmol/L   Potassium 4.9 3.5 - 5.1 mmol/L   Chloride 98 (L) 101 - 111 mmol/L   CO2 25 22 - 32 mmol/L   Glucose, Bld 175 (H) 65 - 99 mg/dL   BUN 39 (H) 6 - 20 mg/dL   Creatinine, Ser 2.23 (H) 0.61 - 1.24 mg/dL   Calcium 9.3 8.9 - 10.3 mg/dL   Total Protein 9.1 (H) 6.5 - 8.1 g/dL   Albumin 4.3 3.5 - 5.0 g/dL   AST 24 15 - 41 U/L  ALT 11 (L) 17 - 63 U/L   Alkaline Phosphatase 94 38 - 126 U/L   Total Bilirubin 0.5 0.3 - 1.2 mg/dL   GFR calc non Af Amer 23 (L) >60 mL/min   GFR calc Af Amer 27 (L) >60 mL/min    Comment: (NOTE) The eGFR has been calculated using the CKD EPI equation. This calculation has not been validated in all clinical situations. eGFR's persistently <60 mL/min signify possible Chronic Kidney Disease.    Anion gap 13 5 - 15  Lipase, blood     Status: None   Collection Time: 11/22/14  8:55 PM  Result Value Ref Range   Lipase 47 22 - 51 U/L   Ct Abdomen Pelvis Wo Contrast  11/22/2014   CLINICAL DATA:  Right-sided inguinal hernia with nausea and vomiting.  EXAM: CT ABDOMEN AND PELVIS WITHOUT CONTRAST  TECHNIQUE: Multidetector CT imaging of the abdomen and pelvis was performed following the standard protocol without IV contrast.  COMPARISON:  03/22/2013  FINDINGS: BODY WALL: There is a hernia in the right groin, likely direct inguinal, which contains  low-density fluid. Bowel is present near the deep margin of the hernia, but does not appear involved.  LOWER CHEST: Moderate sliding hiatal hernia. Lobulated low-density along the lower esophagus favors trapped fluid.  Extensive coronary atherosclerosis.  ABDOMEN/PELVIS:  Liver: No focal abnormality.  Biliary: Cholelithiasis.  No evidence of acute cholecystitis.  Pancreas: Diffuse atrophy without ductal enlargement.  Spleen: Unremarkable.  Adrenals: Negative  Kidneys and ureters: The left kidney is expanded without internal architecture and there is nodular thickening along the capsular surface which extends along the upper ureter. There are 2 known solid masses within the right kidney, 21 mm exophytic from the upper pole and 32 mm exophytic from the lower pole. Additional high-density foci on the right are likely cysts with internal hemorrhage or protein. No hydronephrosis or urolithiasis.  Bladder: Moderately distended.  Reproductive: Symmetric prostate enlargement.  Bowel: The small bowel mesenteric fat appears somewhat hazy and there could be enteritis. Fluid levels seen to the level of distal colon suggesting a diarrheal illness. A nonobstructive incidental 16 mm lipoma is seen within the distal ileum. There is extensive colonic diverticulosis. No appendicitis.  Peritoneum: Trace ascites.  No pneumoperitoneum.  Vascular: Extensive atherosclerosis.  No acute arterial findings.  OSSEOUS: Profound osteopenia. Ankylosis of the sacroiliac joints and bridging syndesmophytes in the lower thoracic spine. Remote intertrochanteric left femur fracture status post ORIF. Remote fracture across the T12 level, extending through the posterior elements. There is focal kyphosis at this level and at least moderate canal stenosis.  IMPRESSION: 1. Small bowel edema which could be from enteritis or ischemia. No evidence of perforation or obstruction. 2. The right groin hernia contains ascitic fluid but no bowel. 3. Bilateral renal  neoplasm, 2 on the right but larger on the left. The left renal tumor infiltrates the entire kidney and spreads along the upper ureter, favoring transitional cell carcinoma. 4. Ankylosing spondylitis with chronic T12 fracture, 3 column with pseudoarthrosis, focal kyphosis, and canal stenosis. 5. Nodularity along the lower esophagus, favor trapped ascitic fluid over adenopathy. 6. Cholelithiasis without acute cholecystitis.   Electronically Signed   By: Monte Fantasia M.D.   On: 11/22/2014 23:15   [place] Extrem Low Right Ltd  11/22/2014   CLINICAL DATA:  Patient with bulging right groin mass, increased in size.  EXAM: ULTRASOUND RIGHT LOWER EXTREMITY LIMITED  TECHNIQUE: Ultrasound examination of the lower extremity soft tissues was performed in  the area of clinical concern.  COMPARISON:  CT abdomen pelvis 03/22/2013  FINDINGS: At the area of concern within the right suprapubic location there is an underlying 4.1 x 2.1 x 3.2 cm predominantly cystic mass with thick internal septations. There is peripheral vascularity.  IMPRESSION: Nonspecific complex cystic mass within the area of palpable concern. This may potentially represent an abscess. If in the appropriate location, bowel containing hernia is not excluded. Recommend clinical correlation and possible correlation with CT as clinically indicated.  These results will be called to the ordering clinician or representative by the Radiologist Assistant, and communication documented in the PACS or zVision Dashboard.   Electronically Signed   By: Lovey Newcomer M.D.   On: 11/22/2014 15:40    Assessment/Plan:  CT scan is personally reviewed minimal bowel edema noted in the report is not appreciated. Large left kidney masses. Small right inguinal hernia.  Dr. Corky Downs was able to reduce this hernia and it popped back out I was able to reduce the hernias well without much difficulty. Discussed with the family the need to keep this reduced as well as to utilize [facility]. I  described for them what a truss was aware to obtain one tomorrow morning.  I also discussed in detail the potential for surgical intervention area however the patient and family of made a conscious decision 2 years ago to not pursue nephrectomy for a likely kidney cancer area no biopsy was ever performed. No treatment has ever been given. This in mind repair of a recurrent inguinal inguinal hernia which can be treated easily with a truss is not warranted. They can follow up in our office since they change their mind.  I discussed this with Dr. Corky Downs as well who is in agreement with the plan.  Florene Glen, MD, FACS

## 2014-11-22 NOTE — ED Provider Notes (Signed)
[facility] Emergency Department Provider Note  ____________________________________________  Time seen: On arrival  I have reviewed the triage vital signs and the nursing notes.   HISTORY  Chief Complaint Emesis; Abdominal Pain; and Hernia    HPI Albert Schwartz is a 81 y.o. male who presents with abdominal pain and possible hernia in the right groin. He was seen in urgent care this morning and had an ultrasound of his groin and told to follow-up with surgery. Reportedly he developed worsening pain nausea and vomiting and the hernia became larger and firm. No fevers and chills reported     Past Medical History  Diagnosis Date  . Hypertension     Patient Active Problem List   Diagnosis Date Noted  . Absolute anemia 11/22/2014  . Chronic constipation 11/22/2014  . Racket nail 11/22/2014  . Back ache 11/22/2014  . Essential (primary) hypertension 11/22/2014  . Blood in the urine 11/22/2014  . Glaucoma, low tension 11/22/2014  . Frank hematuria 12/24/2013  . 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  Current Outpatient Rx  Name  Route  Sig  Dispense  Refill  . acetaminophen (TYLENOL) 500 MG tablet   Oral   Take by mouth.         . brimonidine (ALPHAGAN P) 0.1 % SOLN   Ophthalmic   Apply to eye.         . ferrous [id number] C-folic acid (FEROCON) capsule   Oral   Take 1 capsule by mouth 2 (two) times daily.          . finasteride (PROSCAR) 5 MG tablet   Oral   Take by mouth.         Marland Kitchen lisinopril (PRINIVIL,ZESTRIL) 5 MG tablet   Oral   Take by mouth.         . loratadine (CLARITIN) 10 MG tablet   Oral   Take by mouth.         . polyethylene glycol (MIRALAX / GLYCOLAX) packet   Oral   Take by mouth.         . predniSONE (DELTASONE) 10 MG tablet   Oral   Take 1 tablet (10 mg total) by mouth daily with breakfast.   15  tablet   3     Allergies Abacavir and Procaine  Family History  Problem Relation Age of Onset  . Healthy Mother   . Healthy Father     Social History History  Substance Use Topics  . Smoking status: Never Smoker   . Smokeless tobacco: Not on file  . Alcohol Use: No    Review of Systems  Constitutional: Negative for fever. Eyes: Negative for visual changes. ENT: Negative for sore throat Cardiovascular: Negative for chest pain. Respiratory: Negative for shortness of breath. Gastrointestinal: Positive for abdominal pain and nausea Genitourinary: Negative for dysuria. Musculoskeletal: Negative for back pain. Skin: Negative for rash.   10-point ROS otherwise negative.  ____________________________________________   PHYSICAL EXAM:  VITAL SIGNS: ED Triage Vitals  Enc Vitals Group     BP 11/22/14 2038 154/87 mmHg     Pulse Rate 11/22/14 2038 86     Resp 11/22/14 2038 21     Temp 11/22/14 2038 97.7 F (36.5 C)     Temp Source 11/22/14 2038 Oral     SpO2 11/22/14 2038 100 %     Weight --  Height --      Head Cir --      Peak Flow --      Pain Score --      Pain Loc --      Pain Edu? --      Excl. in [place]? --      Constitutional: Alert and oriented. Well appearing and in no distress. Hard of hearing Eyes: Conjunctivae are normal.  ENT   Head: Normocephalic and atraumatic.   Mouth/Throat: Mucous membranes are moist. Cardiovascular: Normal rate, regular rhythm. Normal and symmetric distal pulses are present in all extremities. No murmurs, rubs, or gallops. Respiratory: Normal respiratory effort without tachypnea nor retractions. Breath sounds are clear and equal bilaterally.  Gastrointestinal: Soft and non-tender in all quadrants. No distention. There is no CVA tenderness. Genitourinary: Patient with kiwi sized firm right inguinal hernia that is tender to palpation. No erythema noted Musculoskeletal: Nontender with normal range of motion in all  extremities. No lower extremity tenderness nor edema. Neurologic:  Normal speech and language. No gross focal neurologic deficits are appreciated. Skin:  Skin is warm, dry and intact. No rash noted. Psychiatric: Mood and affect are normal. Patient exhibits appropriate insight and judgment.  ____________________________________________    LABS (pertinent positives/negatives)  Labs Reviewed  CBC  COMPREHENSIVE METABOLIC PANEL  LIPASE, BLOOD    ____________________________________________   EKG  None  ____________________________________________    RADIOLOGY I have personally reviewed any xrays that were ordered on this patient: CT abdomen and pelvis shows mild small bowel edema  ____________________________________________   PROCEDURES  Procedure(s) performed: hernia reduction  Verbal consent obtained from patient Placed in trendelenburg and gentle but firm pressure placed on hernia with successful reduction  Critical Care performed: none  ____________________________________________   INITIAL IMPRESSION / ASSESSMENT AND PLAN / ED COURSE  Pertinent labs & imaging results that were available during my care of the patient were reviewed by me and considered in my medical decision making (see chart for details).  Patient with evidence of hernia which I was able to reduce. CT head and pelvis shows small area of small bowel edema likely related to reduction. I consulted to surgery to evaluate the patient  Dr. Burt Knack will see the patient in the ED  ----------------------------------------- 11:43 PM on 11/22/2014 -----------------------------------------  Dr. Burt Knack recommends discharge with outpatient surgery follow-up   ____________________________________________   FINAL CLINICAL IMPRESSION(S) / ED DIAGNOSES  Final diagnoses:  Right groin hernia     Lavonia Drafts, MD 11/22/14 [phone]

## 2014-11-22 NOTE — Patient Instructions (Signed)
We will have your mass evaluated via ultrasound.  Your ultrasound is at 4pm at [facility]. If anything requiring immediate surgical consult is recommended, we will have you seen in the ER by surgeon on call.   If the mass is stable, we will have you seen by surgery on Monday or Tuesday.    Please seek immediate medical attention for: severe pain in the R groin, fever, redness at the site, or any concerning symptoms.

## 2014-11-22 NOTE — ED Notes (Signed)
Family reports pt with hernia dx'd this morning.  Patient has been vomiting with increased pain for the past several hours.

## 2014-11-22 NOTE — Progress Notes (Signed)
Subjective:    Patient ID: Kevron Patella, male    DOB: 1915/08/10, 81 y.o.   MRN: [id number]  HPI: Brandell Maready is a 81 y.o. male presenting on 11/22/2014 for Hernia   HPI  Pt presents today for possible hernia. Home health RN was helping to bathe him and has noticed a bulge in the R groin. RN described it as golf ball sized. Bulge is described as noticeable but non-tender.  No numbness or tingling in the area. No known trauma.  No heavy object lifting. Has not noticed a bulge in the area before. Bulge is flesh toned. Pt is able to walk without pain.   Past Medical History  Diagnosis Date  . Hypertension     Current Outpatient Prescriptions on File Prior to Visit  Medication Sig  . polyethylene glycol (MIRALAX / GLYCOLAX) packet Take by mouth.  . predniSONE (DELTASONE) 10 MG tablet Take 1 tablet (10 mg total) by mouth daily with breakfast.   No current facility-administered medications on file prior to visit.    Review of Systems  Constitutional: Negative for fever and chills.  Respiratory: Negative for chest tightness, shortness of breath and wheezing.   Gastrointestinal: Negative for abdominal pain, blood in stool and abdominal distention.  Neurological: Negative.  Negative for dizziness, weakness and numbness.   Per HPI unless specifically indicated above     Objective:    BP 150/78 mmHg  Pulse 80  Temp(Src) 98.4 F (36.9 C)  Resp 16  Ht 5\' 7"  (1.702 m)  Wt 128 lb 3.2 oz (58.151 kg)  BMI 20.07 kg/m2  SpO2 97%  Wt Readings from Last 3 Encounters:  11/22/14 128 lb 3.2 oz (58.151 kg)  11/14/14 127 lb 3.3 oz (57.7 kg)    Physical Exam  Cardiovascular: Normal rate and regular rhythm.  Exam reveals no gallop and no friction rub.   No murmur heard. Abdominal: Normal appearance and bowel sounds are normal. He exhibits no distension, no abdominal bruit and no ascites. There is no tenderness. A hernia is present. Hernia confirmed positive in the right inguinal area.      Results for orders placed or performed in visit on 11/14/14  CBC with Differential  Result Value Ref Range   WBC 5.6 3.8 - 10.6 K/uL   RBC 3.26 (L) 4.40 - 5.90 MIL/uL   Hemoglobin 10.8 (L) 13.0 - 18.0 g/dL   HCT 33.0 (L) 40.0 - 52.0 %   MCV 101.5 (H) 80.0 - 100.0 fL   MCH 33.1 26.0 - 34.0 pg   MCHC 32.7 32.0 - 36.0 g/dL   RDW 13.1 11.5 - 14.5 %   Platelets 140 (L) 150 - 440 K/uL   Neutrophils Relative % 65 %   Neutro Abs 3.7 1.4 - 6.5 K/uL   Lymphocytes Relative 16 %   Lymphs Abs 0.9 (L) 1.0 - 3.6 K/uL   Monocytes Relative 11 %   Monocytes Absolute 0.6 0.2 - 1.0 K/uL   Eosinophils Relative 7 %   Eosinophils Absolute 0.4 0 - 0.7 K/uL   Basophils Relative 1 %   Basophils Absolute 0.0 0 - 0.1 K/uL  Comprehensive metabolic panel  Result Value Ref Range   Sodium 132 (L) 135 - 145 mmol/L   Potassium 5.1 3.5 - 5.1 mmol/L   Chloride 104 101 - 111 mmol/L   CO2 24 22 - 32 mmol/L   Glucose, Bld 91 65 - 99 mg/dL   BUN 44 (H) 6 - 20 mg/dL  Creatinine, Ser 2.10 (H) 0.61 - 1.24 mg/dL   Calcium 7.8 (L) 8.9 - 10.3 mg/dL   Total Protein 7.4 6.5 - 8.1 g/dL   Albumin 3.5 3.5 - 5.0 g/dL   AST 17 15 - 41 U/L   ALT 11 (L) 17 - 63 U/L   Alkaline Phosphatase 81 38 - 126 U/L   Total Bilirubin 0.4 0.3 - 1.2 mg/dL   GFR calc non Af Amer 25 (L) >60 mL/min   GFR calc Af Amer 29 (L) >60 mL/min   Anion gap 4 (L) 5 - 15      Assessment & Plan:   Problem List Items Addressed This Visit    None    Visit Diagnoses    Right groin mass    -  Primary    Possible hernia vs abcess. Consulted [facility] RN- recommened stat ultrasound to determine what it is and if immediate surgical consult is needed. If bowel is not involved or incarcerated okay to wait for surgical consult over the weekend. Alarm symptoms reviewed with patient and family. They are requesting to see Dr. Bary Castilla at [facility].       Orders Placed This Encounter  Procedures  . [place] Extrem Low Right Ltd     Standing Status: Future     Number of Occurrences:      Standing Expiration Date: 01/23/2016    Scheduling Instructions:     Non vascular.    Order Specific Question:  Reason for Exam (SYMPTOM  OR DIAGNOSIS REQUIRED)    Answer:  R groin bulging mass x 1 day.    Order Specific Question:  Preferred imaging location?    Answer:  [facility]    Order Specific Question:  Call Report- Best Contact Number?    Answer:  [phone] AMY KREBS CELL #  . Ambulatory referral to General Surgery    Referral Priority:  Urgent    Referral Type:  Surgical    Referral Reason:  Specialty Services Required    Requested Specialty:  General Surgery    Number of Visits Requested:  1   Meds ordered this encounter  Medications  . acetaminophen (TYLENOL) 500 MG tablet    Sig: Take by mouth.  . brimonidine (ALPHAGAN P) 0.1 % SOLN    Sig: Apply to eye.  . loratadine (CLARITIN) 10 MG tablet    Sig: Take by mouth.  . DISCONTD: MAGNESIUM CITRATE PO    Sig: Take by mouth.  . ferrous [id number] C-folic acid (FEROCON) capsule    Sig: Take 1 capsule by mouth 2 (two) times daily.   . finasteride (PROSCAR) 5 MG tablet    Sig: Take by mouth.  Marland Kitchen lisinopril (PRINIVIL,ZESTRIL) 5 MG tablet    Sig: Take by mouth.      Follow up plan: Return if symptoms worsen or fail to improve.

## 2014-11-22 NOTE — Discharge Instructions (Signed)

## 2014-11-23 DIAGNOSIS — K4090 Unilateral inguinal hernia, without obstruction or gangrene, not specified as recurrent: Secondary | ICD-10-CM | POA: Insufficient documentation

## 2014-11-23 MED ORDER — ACETAMINOPHEN 500 MG PO TABS
1000.0000 mg | ORAL_TABLET | Freq: Once | ORAL | Status: AC
  Administered 2014-11-23: 1000 mg via ORAL

## 2014-11-23 MED ORDER — ACETAMINOPHEN 500 MG PO TABS
ORAL_TABLET | ORAL | Status: AC
  Administered 2014-11-23: 1000 mg via ORAL
  Filled 2014-11-23: qty 2

## 2014-11-25 NOTE — Progress Notes (Signed)
Pt has appointment on Wednesday 11/27/2014 at 3:45 pm needs to arrive by 3:15 pm with I.D and insurance card and all medication it it's original bottle pt's daughter aware regarding appointment date and time. Nisha

## 2014-11-27 ENCOUNTER — Ambulatory Visit (INDEPENDENT_AMBULATORY_CARE_PROVIDER_SITE_OTHER): Payer: Medicare Other | Admitting: General Surgery

## 2014-11-27 ENCOUNTER — Encounter: Payer: Self-pay | Admitting: General Surgery

## 2014-11-27 VITALS — BP 130/70 | HR 68 | Resp 18 | Ht 67.0 in | Wt 127.0 lb

## 2014-11-27 DIAGNOSIS — K4090 Unilateral inguinal hernia, without obstruction or gangrene, not specified as recurrent: Secondary | ICD-10-CM | POA: Diagnosis not present

## 2014-11-27 NOTE — Patient Instructions (Signed)

## 2014-11-27 NOTE — Progress Notes (Signed)
Patient ID: Albert Schwartz, male   DOB: July 09, 1915, 81 y.o.   MRN: [id number]  Chief Complaint  Patient presents with  . Other    right groin mass    HPI Albert Schwartz is a 81 y.o. male here for evaluation of a right groin mass. He had a CT and Ultrasound done of the area on 11/22/14 at [facility] at ED. He first noticed this five days ago.  HPI  Past Medical History  Diagnosis Date  . Hypertension   . Cancer     kidney  . Anemia     Past Surgical History  Procedure Laterality Date  . Hip fracture surgery  2014    Family History  Problem Relation Age of Onset  . Heart attack Brother   . Stroke Brother     Social History History  Substance Use Topics  . Smoking status: Never Smoker   . Smokeless tobacco: Never Used  . Alcohol Use: No    Allergies  Allergen Reactions  . Abacavir   . Procaine Nausea And Vomiting    Current Outpatient Prescriptions  Medication Sig Dispense Refill  . acetaminophen (TYLENOL) 500 MG tablet Take 500 mg by mouth every 6 (six) hours as needed.     . brimonidine (ALPHAGAN P) 0.1 % SOLN Apply to eye.    . ferrous [id number] C-folic acid (FEROCON) capsule Take 1 capsule by mouth 2 (two) times daily.     . finasteride (PROSCAR) 5 MG tablet Take by mouth.    Marland Kitchen lisinopril (PRINIVIL,ZESTRIL) 5 MG tablet Take by mouth.    . loratadine (CLARITIN) 10 MG tablet Take by mouth.    . polyethylene glycol (MIRALAX / GLYCOLAX) packet Take by mouth.    . predniSONE (DELTASONE) 10 MG tablet Take 1 tablet (10 mg total) by mouth daily with breakfast. 15 tablet 3  . HYDROcodone-acetaminophen (NORCO/VICODIN) 5-325 MG per tablet Take 1 tablet by mouth every 4 (four) hours as needed for moderate pain. 20 tablet 0   No current facility-administered medications for this visit.    Review of Systems Review of Systems  Constitutional: Negative.   Respiratory: Negative.   Cardiovascular: Negative.   Gastrointestinal: Negative.     Blood pressure 130/70,  pulse 68, resp. rate 18, height 5\' 7"  (1.702 m), weight 127 lb (57.607 kg).  Physical Exam Physical Exam  Constitutional: He is oriented to person, place, and time. He appears well-developed and well-nourished.  Cardiovascular: Normal rate, regular rhythm and normal heart sounds.   Pulmonary/Chest: Effort normal and breath sounds normal.  Abdominal: Soft. Normal appearance and bowel sounds are normal. There is no tenderness.    Neurological: He is alert and oriented to person, place, and time.  Skin: Skin is warm and dry.    Data Reviewed CT scan of 11/22/2014 as well as associated ultrasound reviewed. Notable findings include calcified gallstones and bilateral renal carcinoma. Changes in the right groin are those of a hernia without bowel involvement.  Assessment    Right inguinal hernia, presently asymptomatic.    Plan    The patient still lives independently with home health aids. He is handicapped by his poor hearing, but is able to go out to his family members for dining, and is up and about and ambulatory. At this time is asymptomatic. Options for management were reviewed: 1) a wait-and-see approach to see if he has recurrent episodes of swelling (and most recently likely with associated transient incarceration of the small bowel based on findings on  CT and clinical report of nausea/vomiting and diarrhea) versus 2) elective repair. In light of his progressive renal disease, quite significant over the past 12-18 months on serial CT exams this is a difficult call.  2 of his children are present today, his oldest child, a son and his youngest child, a daughter were present. His son lives in [place], his daughter lives locally.  They'll speak with her siblings and with their father to see his desires. Right now I think he is leaning towards observation unless he has another episode of pain. He might be able to have repair under local anesthesia with sedation, but his general physical  health is pretty good and certainly general anesthesia would be considered if he had another episode of incarceration. Ports calling promptly should he ever current pain or develop any nausea/vomiting was emphasized.     .  Ref: Dr Fredia Sorrow PCP: Dr Larene Beach  Robert Bellow 11/27/2014, 8:48 PM

## 2014-12-13 ENCOUNTER — Telehealth: Payer: Self-pay | Admitting: *Deleted

## 2014-12-13 DIAGNOSIS — N2889 Other specified disorders of kidney and ureter: Secondary | ICD-10-CM

## 2014-12-13 MED ORDER — PREDNISONE 10 MG PO TABS: 10.0000 mg | ORAL_TABLET | Freq: Every day | ORAL | Status: DC

## 2014-12-13 NOTE — Telephone Encounter (Signed)
Escribed

## 2015-06-02 ENCOUNTER — Other Ambulatory Visit: Payer: Self-pay | Admitting: *Deleted

## 2015-06-02 DIAGNOSIS — N2889 Other specified disorders of kidney and ureter: Secondary | ICD-10-CM

## 2015-06-02 MED ORDER — PREDNISONE 10 MG PO TABS
10.0000 mg | ORAL_TABLET | Freq: Every day | ORAL | Status: AC
Start: 2015-06-02 — End: ?

## 2015-07-17 ENCOUNTER — Ambulatory Visit: Payer: Medicare Other | Admitting: Family Medicine

## 2015-07-18 ENCOUNTER — Telehealth: Payer: Self-pay | Admitting: Family Medicine

## 2015-07-18 NOTE — Telephone Encounter (Signed)
R/T call to [place] at [place] and [facility]: Per the family patient had cancer and was been seen by Dr. Jeb Levering. His last visit was 11/14/14.  HPI:   82 year old gentleman with hematuria was investigated by primary care physician followed by urologist. Patient had abnormal CT scan with bilateral multiple solid tumor masses. Patient was referred to be for further evaluation and treatment consideration. Patient does have chronic back pain MRI scan was done recently after a fall revealed compression fracture., Has not lost significant weight, the patient is very hard of hearing so most of the history as per HPI. March 20, 2014 Patient is here for further follow-up regarding clinically diagnosis bilateral renal cell cancer. Considering patient's age no further aggressive therapy has been prolonged. No abdominal pain. No hematuria. However family reports cloudy urine. No burning. No stinging. Appetite has been stable. Patient lives by [facility]  July, 2016 Patient came today further follow-up regarding bilateral kidney masses which has not been evaluated at patient's request by biopsy. Patient's condition has been slowly declining poor appetite and losing weight. No hematuria or dysuria  No flowsheet data found.

## 2015-07-18 NOTE — Telephone Encounter (Signed)
Albert Schwartz at [place] and [facility] has more info about death certificate [id number]

## 2015-08-16 DEATH — deceased

## 2016-07-12 IMAGING — CT CT ABD-PELV W/O CM
1 of 3 series · 13 of 32 positions shown, 18 images · non-contrast
Comparison: 03/22/2013

CLINICAL DATA: Right-sided inguinal hernia with nausea and
vomiting.

EXAM:
CT ABDOMEN AND PELVIS WITHOUT CONTRAST
TECHNIQUE: Multidetector CT imaging of the abdomen and pelvis was performed
following the standard protocol without IV contrast.

[Series 2: routine abd pel without · axial · non-contrast · 0.73mm/px · z∈[-349,+51]mm · 13 of 92 slices shown, 18 images]
[im 6/92  soft-tissue]
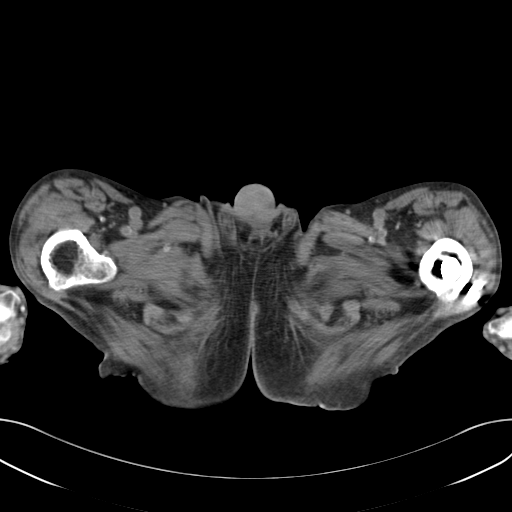
[im 6/92  bone]
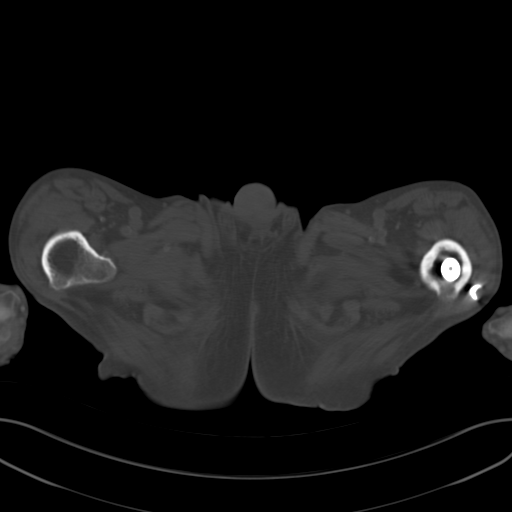
[im 12/92  soft-tissue]
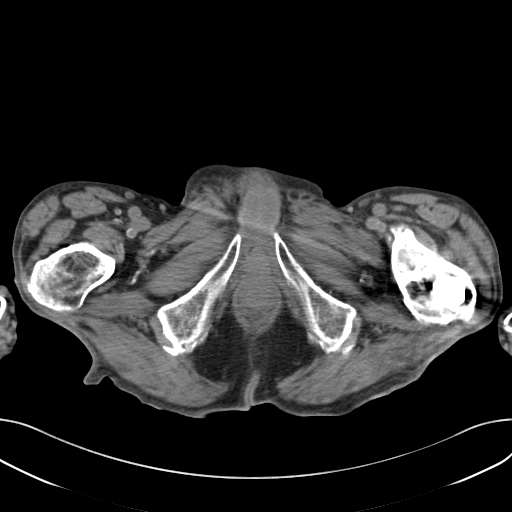
[im 23/92  soft-tissue]
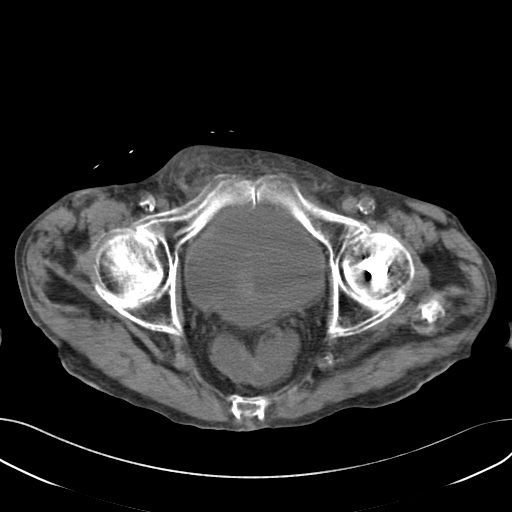
[im 29/92  soft-tissue]
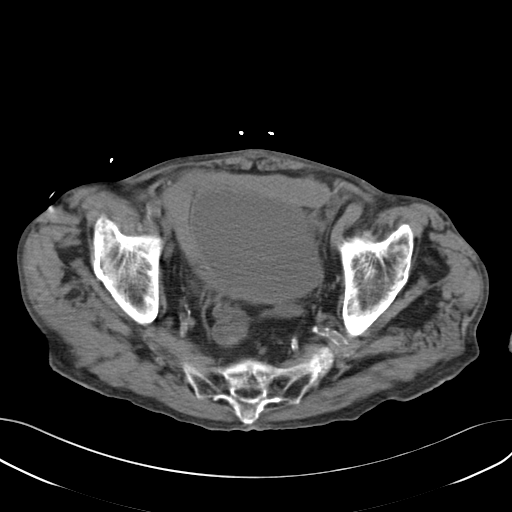
[im 35/92  soft-tissue]
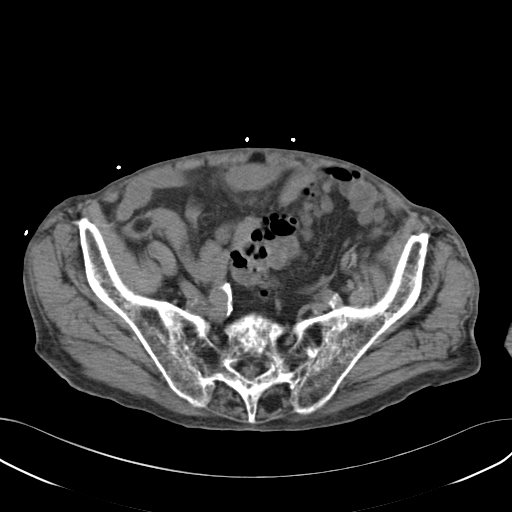
[im 40/92  soft-tissue]
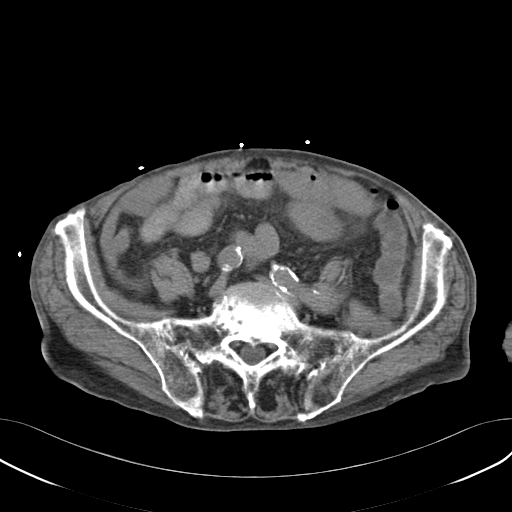
[im 52/92  soft-tissue]
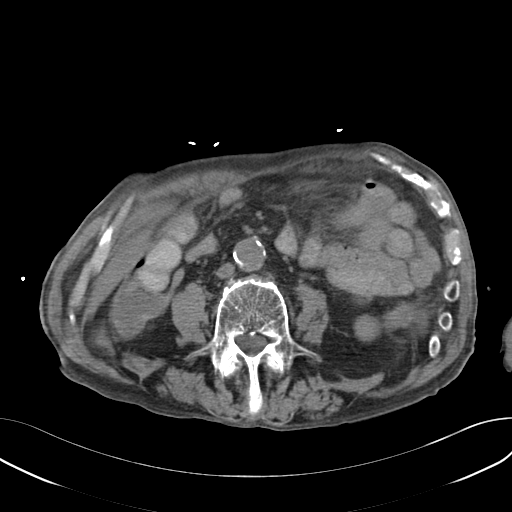
[im 57/92  soft-tissue]
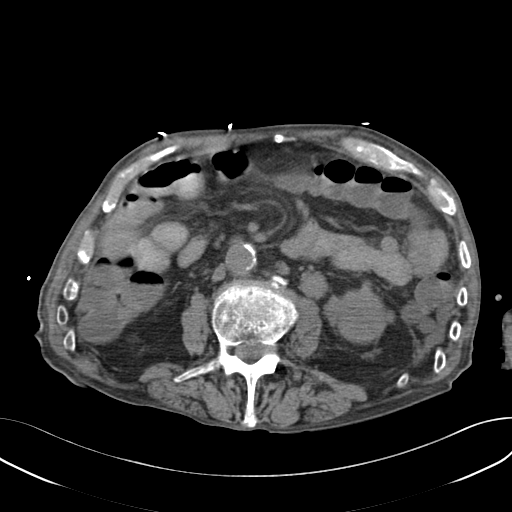
[im 63/92  soft-tissue]
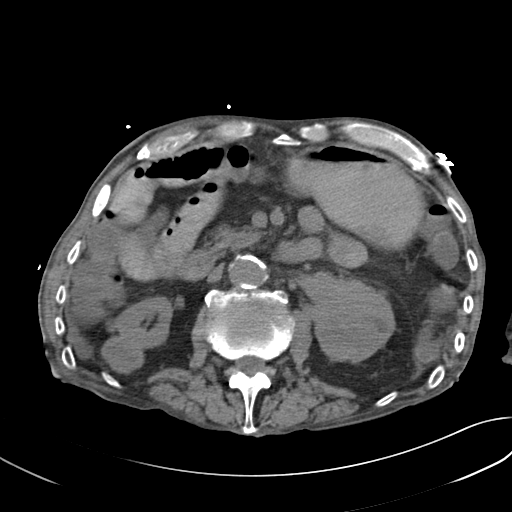
[im 63/92  bone]
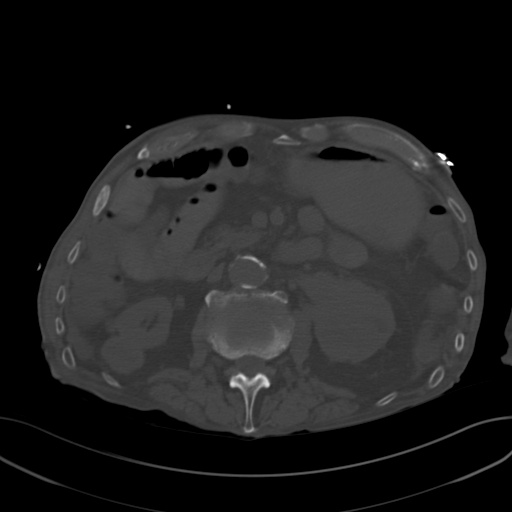
[im 69/92  soft-tissue]
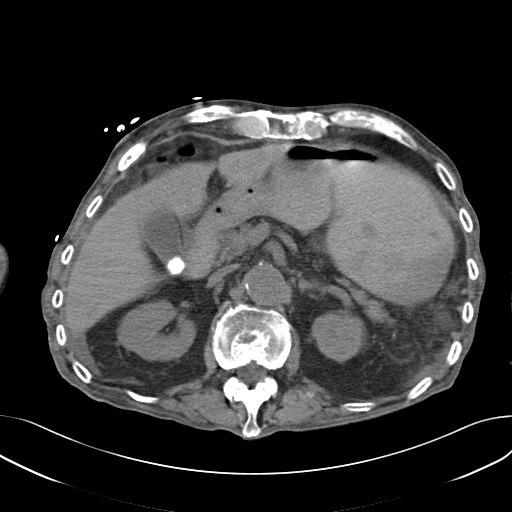
[im 69/92  lung]
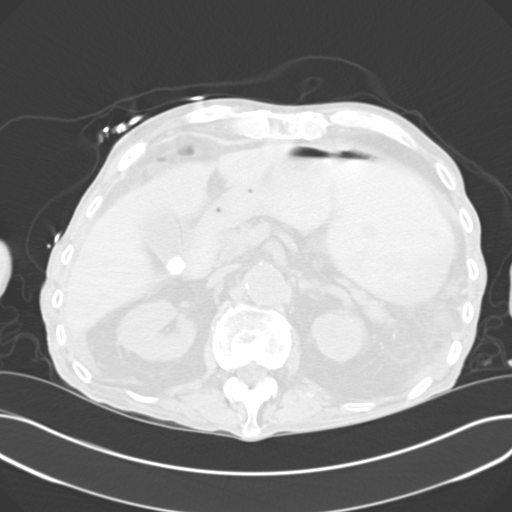
[im 74/92  lung]
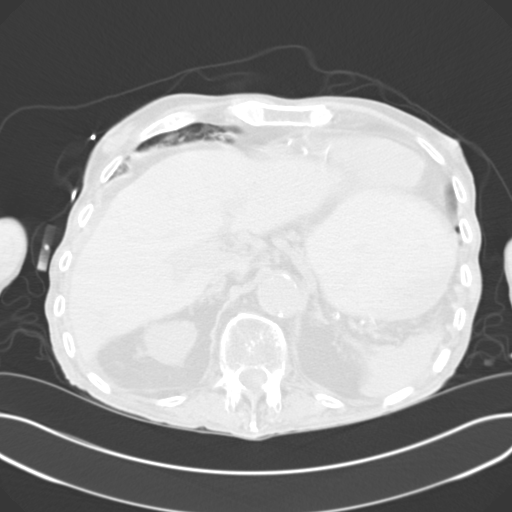
[im 80/92  soft-tissue]
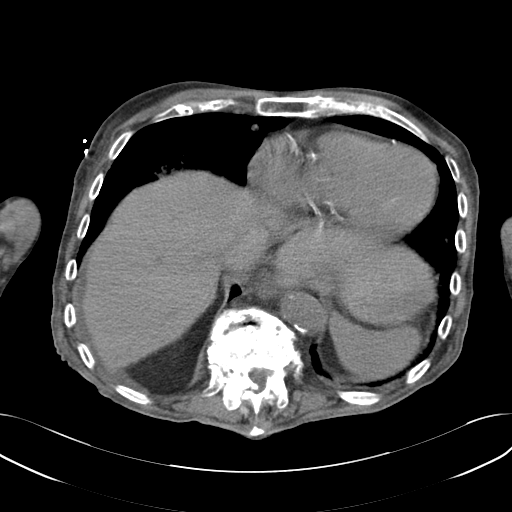
[im 80/92  lung]
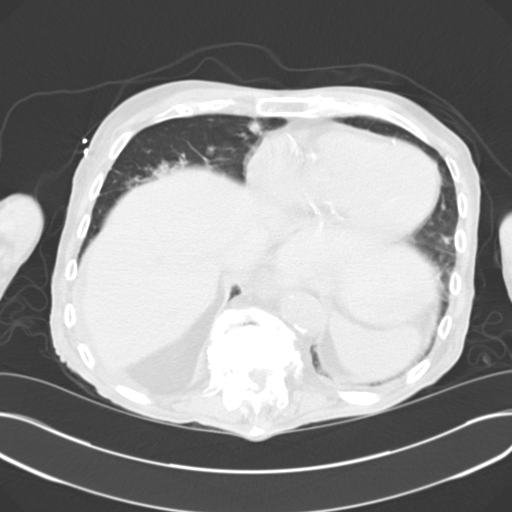
[im 86/92  soft-tissue]
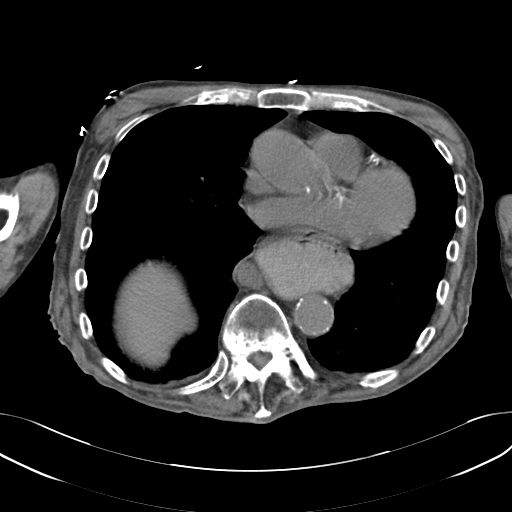
[im 86/92  lung]
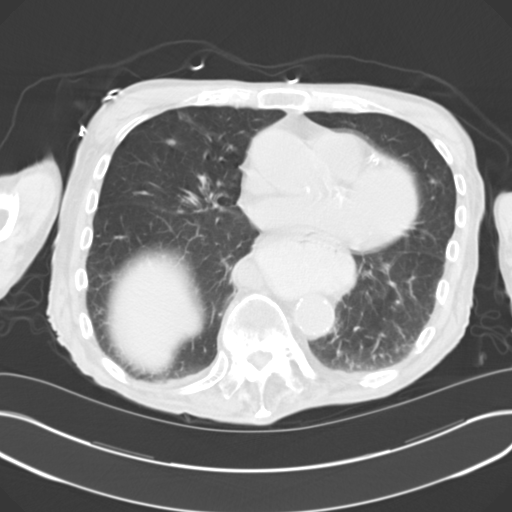

[13 of 32 positions shown; findings below may reference images not displayed]

FINDINGS: BODY WALL: There is a hernia in the right groin, likely direct
inguinal, which contains low-density fluid. Bowel is present near
the deep margin of the hernia, but does not appear involved.

LOWER CHEST: Moderate sliding hiatal hernia. Lobulated low-density
along the lower esophagus favors trapped fluid.

Extensive coronary atherosclerosis.

ABDOMEN/PELVIS:

Liver: No focal abnormality.

Biliary: Cholelithiasis.  No evidence of acute cholecystitis.

Pancreas: Diffuse atrophy without ductal enlargement.

Spleen: Unremarkable.

Adrenals: Negative

Kidneys and ureters: The left kidney is expanded without internal
architecture and there is nodular thickening along the capsular
surface which extends along the upper ureter. There are 2 known
solid masses within the right kidney, 21 mm exophytic from the upper
pole and 32 mm exophytic from the lower pole. Additional
high-density foci on the right are likely cysts with internal
hemorrhage or protein. No hydronephrosis or urolithiasis.

Bladder: Moderately distended.

Reproductive: Symmetric prostate enlargement.

Bowel: The small bowel mesenteric fat appears somewhat hazy and
there could be enteritis. Fluid levels seen to the level of distal
colon suggesting a diarrheal illness. A nonobstructive incidental 16
mm lipoma is seen within the distal ileum. There is extensive
colonic diverticulosis. No appendicitis.

Peritoneum: Trace ascites.  No pneumoperitoneum.

Vascular: Extensive atherosclerosis.  No acute arterial findings.

OSSEOUS: Profound osteopenia. Ankylosis of the sacroiliac joints and
bridging syndesmophytes in the lower thoracic spine. Remote
intertrochanteric left femur fracture status post ORIF. Remote
fracture across the T12 level, extending through the posterior
elements. There is focal kyphosis at this level and at least
moderate canal stenosis.
IMPRESSION: 1. Small bowel edema which could be from enteritis or ischemia. No
evidence of perforation or obstruction.
2. The right groin hernia contains ascitic fluid but no bowel.
3. Bilateral renal neoplasm, 2 on the right but larger on the left.
The left renal tumor infiltrates the entire kidney and spreads along
the upper ureter, favoring transitional cell carcinoma.
4. Ankylosing spondylitis with chronic T12 fracture, 3 column with
pseudoarthrosis, focal kyphosis, and canal stenosis.
5. Nodularity along the lower esophagus, favor trapped ascitic fluid
over adenopathy.
6. Cholelithiasis without acute cholecystitis.

## 2017-04-23 IMAGING — US US EXTREM LOW*R* LIMITED
1 series · 14 of 21 positions shown · non-contrast
Comparison: CT abdomen pelvis 03/22/2013

CLINICAL DATA: Patient with bulging right groin mass, increased in
size.

EXAM:
ULTRASOUND RIGHT LOWER EXTREMITY LIMITED
TECHNIQUE: Ultrasound examination of the lower extremity soft tissues was
performed in the area of clinical concern.

[Series 1: us extrem low*right* limited · 0.08mm/px · 21 acquisitions, 14 frames shown]
[im 1/21]
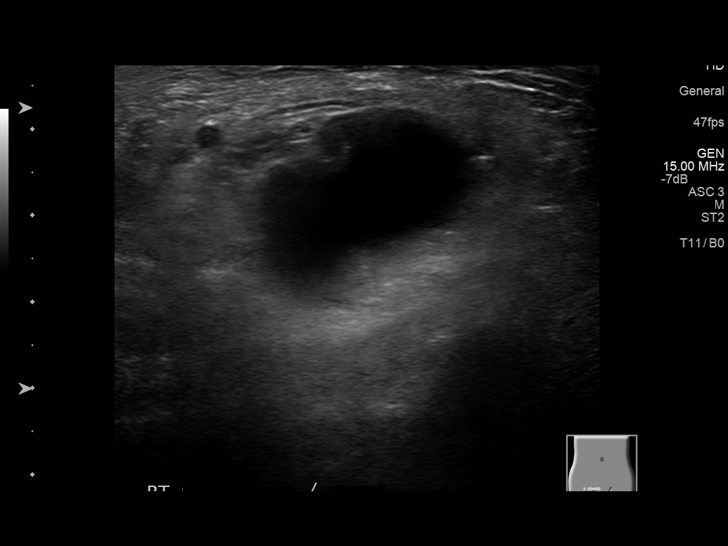
[im 3/21]
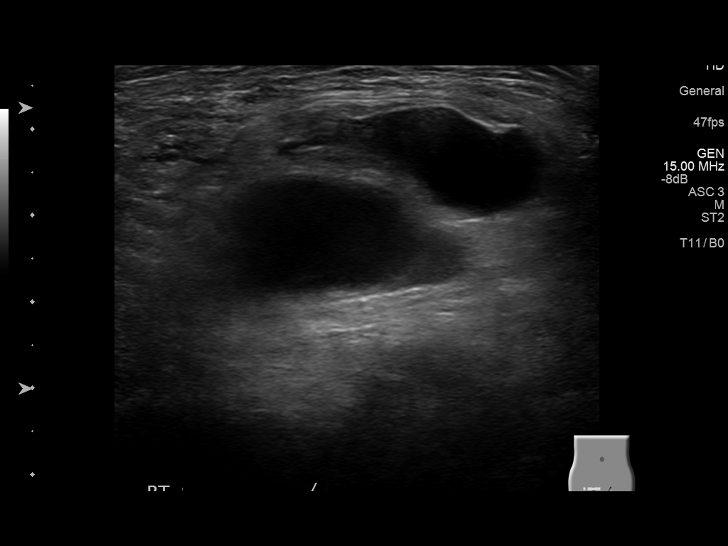
[im 4/21]
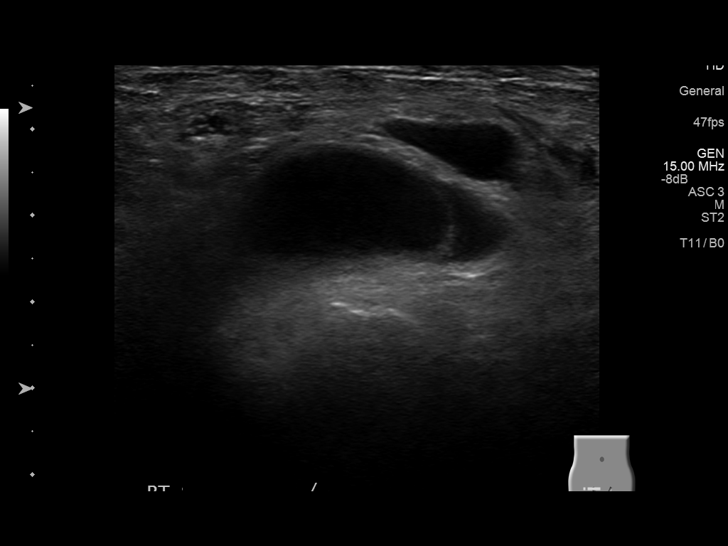
[im 6/21]
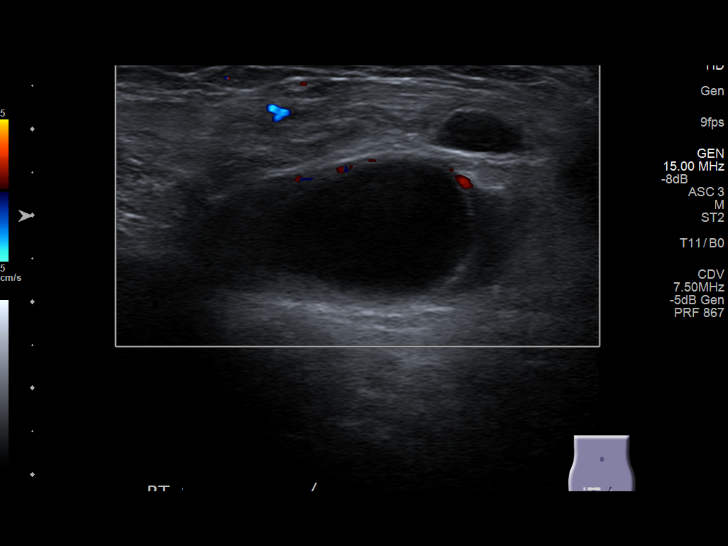
[im 7/21]
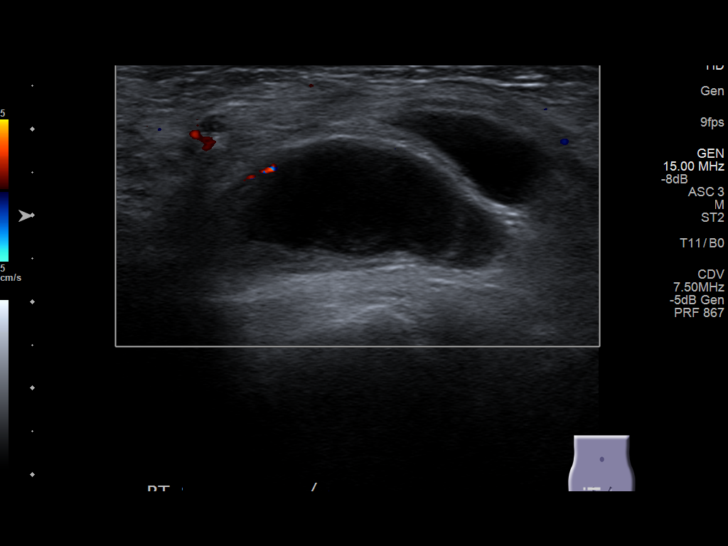
[im 9/21]
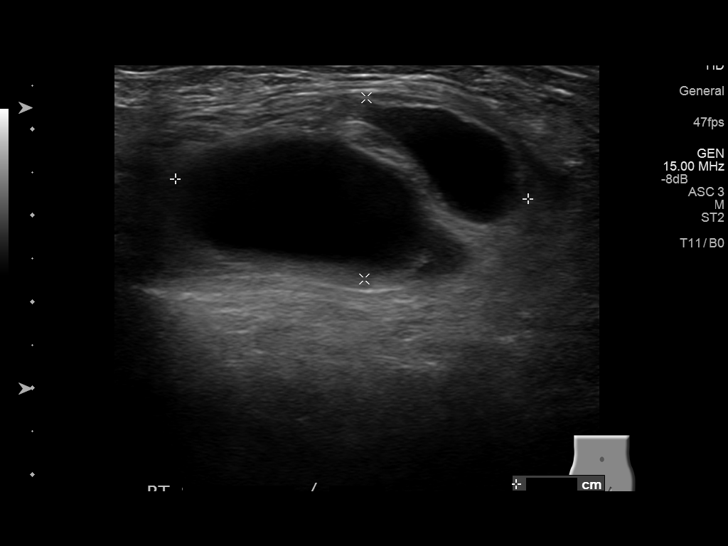
[im 10/21]
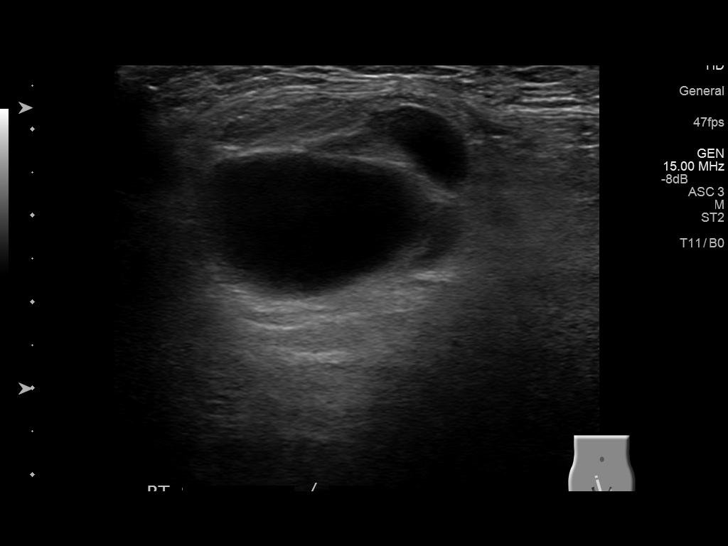
[im 12/21]
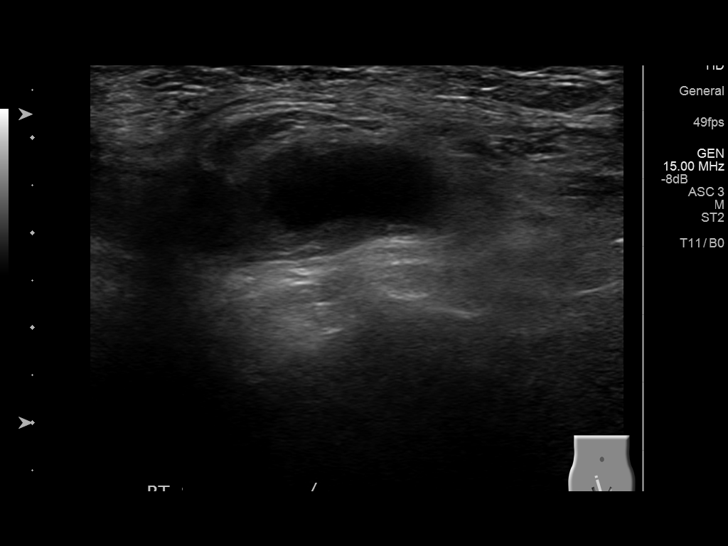
[im 13/21]
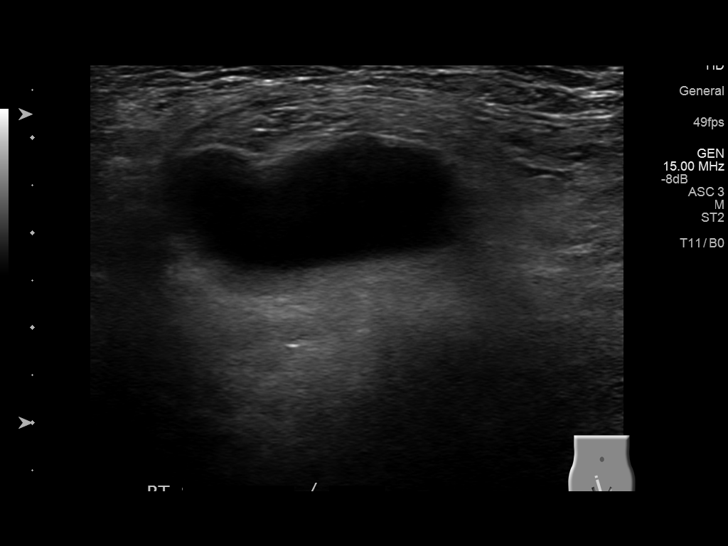
[im 15/21]
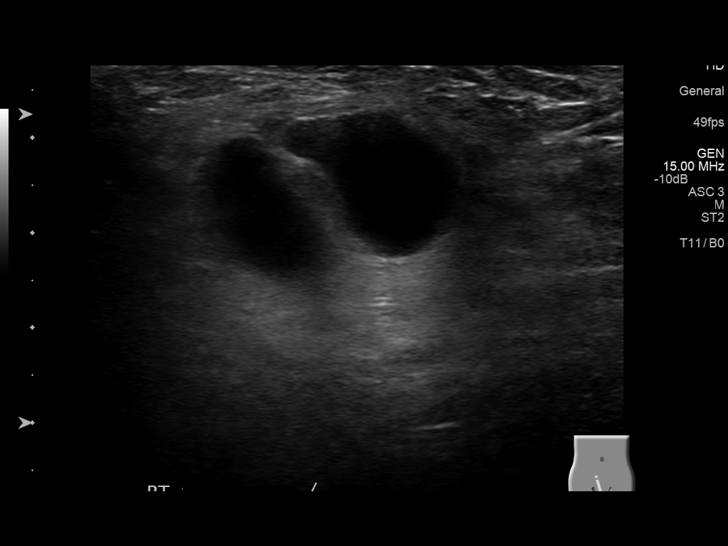
[im 16/21]
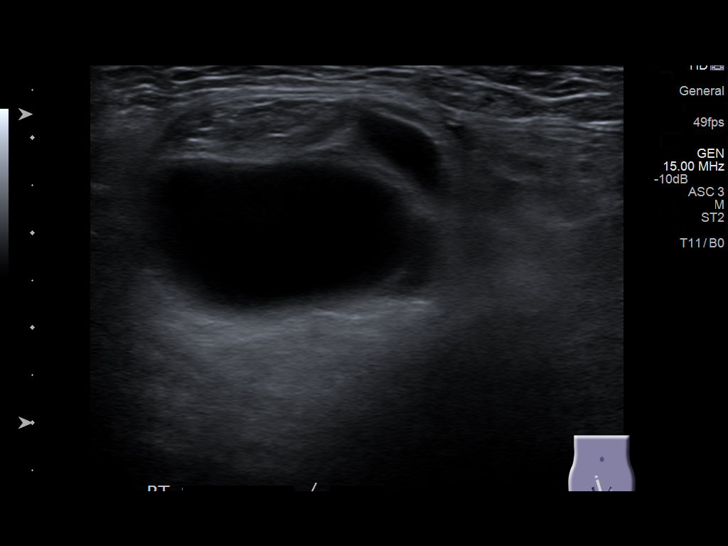
[im 18/21]
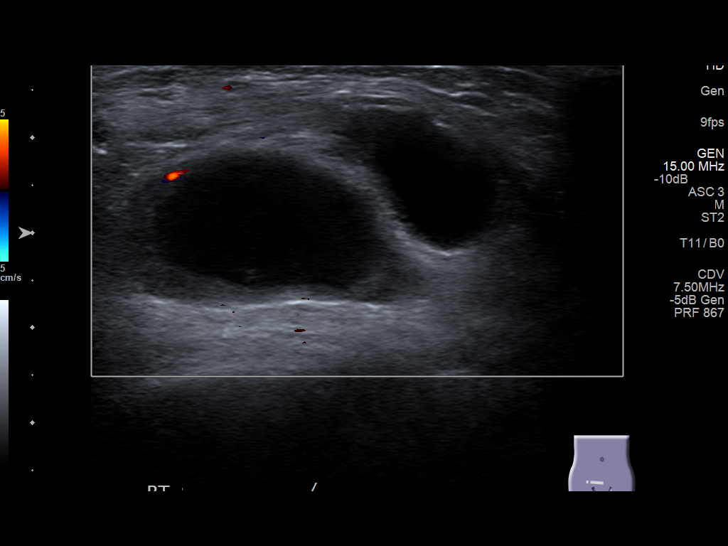
[im 19/21]
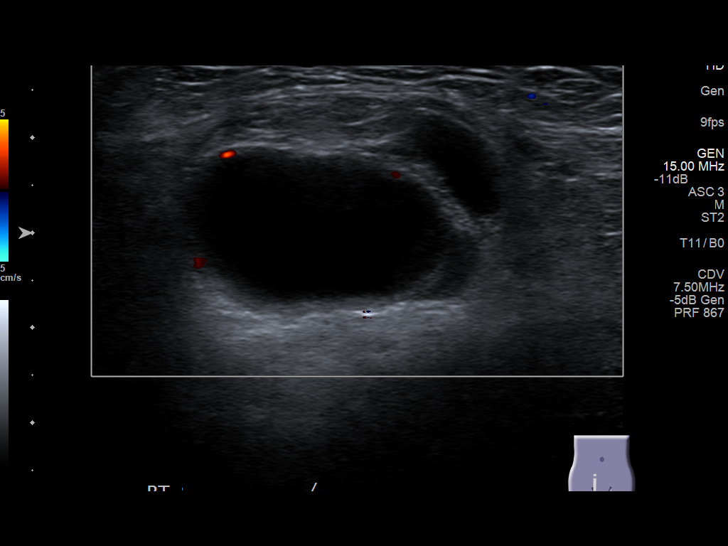
[im 21/21]
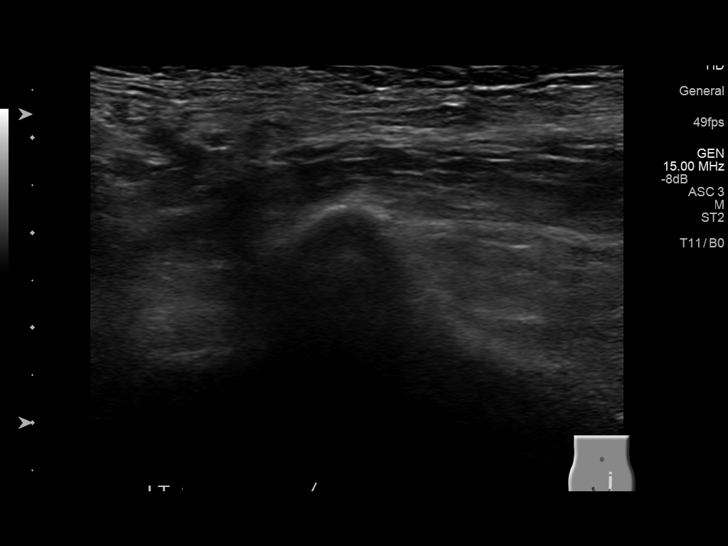

[14 of 21 positions shown; findings below may reference images not displayed]

FINDINGS: At the area of concern within the right suprapubic location there is
an underlying 4.1 x 2.1 x 3.2 cm predominantly cystic mass with
thick internal septations. There is peripheral vascularity.
IMPRESSION: Nonspecific complex cystic mass within the area of palpable concern.
This may potentially represent an abscess. If in the appropriate
location, bowel containing hernia is not excluded. Recommend
clinical correlation and possible correlation with CT as clinically
indicated.

These results will be called to the ordering clinician or
representative by the Radiologist Assistant, and communication
documented in the PACS or zVision Dashboard.
